# Patient Record
Sex: Male | Born: 1975 | Race: Black or African American | Hispanic: No | Marital: Single | Smoking: Never smoker
Health system: Southern US, Community
[De-identification: ages and names within clinical notes are randomized; demographics above are authoritative.]

## PROBLEM LIST (undated history)

## (undated) DIAGNOSIS — I10 Essential (primary) hypertension: Secondary | ICD-10-CM

## (undated) DIAGNOSIS — IMO0001 Reserved for inherently not codable concepts without codable children: Secondary | ICD-10-CM

## (undated) DIAGNOSIS — N189 Chronic kidney disease, unspecified: Secondary | ICD-10-CM

## (undated) DIAGNOSIS — H543 Unqualified visual loss, both eyes: Secondary | ICD-10-CM

## (undated) DIAGNOSIS — D509 Iron deficiency anemia, unspecified: Secondary | ICD-10-CM

## (undated) DIAGNOSIS — H409 Unspecified glaucoma: Secondary | ICD-10-CM

## (undated) DIAGNOSIS — Z992 Dependence on renal dialysis: Secondary | ICD-10-CM

## (undated) DIAGNOSIS — D890 Polyclonal hypergammaglobulinemia: Principal | ICD-10-CM

## (undated) HISTORY — PX: REFRACTIVE SURGERY: SHX103

## (undated) HISTORY — PX: OTHER SURGICAL HISTORY: SHX169

## (undated) HISTORY — DX: Polyclonal hypergammaglobulinemia: D89.0

## (undated) HISTORY — DX: Iron deficiency anemia, unspecified: D50.9

## (undated) HISTORY — PX: EYE SURGERY: SHX253

---

## 2003-09-25 ENCOUNTER — Emergency Department (HOSPITAL_COMMUNITY): Admission: EM | Admit: 2003-09-25 | Discharge: 2003-09-26 | Payer: Self-pay | Admitting: *Deleted

## 2003-09-26 ENCOUNTER — Emergency Department (HOSPITAL_COMMUNITY): Admission: EM | Admit: 2003-09-26 | Discharge: 2003-09-26 | Payer: Self-pay | Admitting: Emergency Medicine

## 2009-10-27 DIAGNOSIS — H409 Unspecified glaucoma: Secondary | ICD-10-CM

## 2009-10-27 HISTORY — DX: Unspecified glaucoma: H40.9

## 2012-01-12 ENCOUNTER — Encounter (HOSPITAL_COMMUNITY): Payer: Self-pay | Admitting: *Deleted

## 2012-01-12 ENCOUNTER — Emergency Department (HOSPITAL_COMMUNITY)
Admission: EM | Admit: 2012-01-12 | Discharge: 2012-01-12 | Disposition: A | Discharge status: Home / Self Care | Payer: Self-pay | Attending: Emergency Medicine | Admitting: Emergency Medicine

## 2012-01-12 DIAGNOSIS — Z794 Long term (current) use of insulin: Secondary | ICD-10-CM | POA: Insufficient documentation

## 2012-01-12 DIAGNOSIS — R609 Edema, unspecified: Secondary | ICD-10-CM | POA: Insufficient documentation

## 2012-01-12 DIAGNOSIS — Z9889 Other specified postprocedural states: Secondary | ICD-10-CM | POA: Insufficient documentation

## 2012-01-12 DIAGNOSIS — H4089 Other specified glaucoma: Secondary | ICD-10-CM | POA: Insufficient documentation

## 2012-01-12 DIAGNOSIS — R51 Headache: Secondary | ICD-10-CM | POA: Insufficient documentation

## 2012-01-12 DIAGNOSIS — E119 Type 2 diabetes mellitus without complications: Secondary | ICD-10-CM | POA: Insufficient documentation

## 2012-01-12 DIAGNOSIS — H409 Unspecified glaucoma: Secondary | ICD-10-CM | POA: Insufficient documentation

## 2012-01-12 DIAGNOSIS — R1110 Vomiting, unspecified: Secondary | ICD-10-CM | POA: Insufficient documentation

## 2012-01-12 DIAGNOSIS — H4050X0 Glaucoma secondary to other eye disorders, unspecified eye, stage unspecified: Secondary | ICD-10-CM

## 2012-01-12 MED ORDER — OXYCODONE-ACETAMINOPHEN 5-325 MG PO TABS
1.0000 | ORAL_TABLET | Freq: Once | ORAL | Status: AC
  Administered 2012-01-12: 1 via ORAL
  Filled 2012-01-12: qty 1

## 2012-01-12 MED ORDER — TETRACAINE HCL 0.5 % OP SOLN
OPHTHALMIC | Status: AC
  Administered 2012-01-12: 08:00:00
  Filled 2012-01-12: qty 2

## 2012-01-12 NOTE — ED Notes (Signed)
States he awoke with headache this am.  Hx of Laser eye surgery both eyes approx 2 weeks ago, then this past Wednesday 3/13 saw his eye doctor and was told he has Glaucoma - the Dr put needles in his left eye.    With HA has experienced frequent bouts of vomiting, small amounts of clear liquid, "dry heaves".  Thought his BP was up, checked it at home and was 230/? Does not have history of high blood pressure.

## 2012-01-12 NOTE — ED Notes (Signed)
Called Dr. Resa Miner again and Sect. Will have  Him return call.

## 2012-01-12 NOTE — Discharge Instructions (Signed)
You have a form of glaucoma which can rapidly lead to permanent blindness. This is also likely to start in your other eye. Your left eye may be permanently blind already due to delay in treatment. I have made an appointment for you with [facility] eye clinic at 2 PM. The clinic is located at [address] in [place]. Make sure you you go to this appointment. Failure to treat this appropriately can leave you permanently blind in both eyes. The phone number for the clinic is [phone].

## 2012-01-12 NOTE — ED Provider Notes (Addendum)
History   This chart was scribed for Dione Booze, MD by Clarita Crane. The patient was seen in room APA11/APA11. Patient's care was started at 0607.    CSN: [id number]  Arrival date & time 01/12/12  0607   First MD Initiated Contact with Patient 01/12/12 [phone]      Chief Complaint  Patient presents with  . Headache  . Nausea  . Hypertension    (Consider location/radiation/quality/duration/timing/severity/associated sxs/prior treatment) HPI Donald Sampson is a 36 y.o. male who presents to the Emergency Department complaining of constant moderate throbbing pain behind left eye radiating to posterior aspect of head and neck onset 5 days ago after having refractive surgery performed on left eye by Dr. Lelon Perla and worsening since with 1 episode of vomiting just prior to examination in ED. Rates pain an 8 out of 10 currently and an 8 out of 10 at its worst. Patient states left eye pain is aggravated by light exposure and relieved with use of Percocet. States he recently ran out of Percocet medication provided after surgery by Dr. Lelon Perla. Denies associated nausea. Patient with h/o diabetes. Patient is a non smoker and non-drinker.  PCP- Felecia Shelling  Past Medical History  Diagnosis Date  . Diabetes mellitus     Past Surgical History  Procedure Date  . Refractive surgery     No family history on file.  History  Substance Use Topics  . Smoking status: Not on file  . Smokeless tobacco: Not on file  . Alcohol Use:       Review of Systems  Eyes: Positive for photophobia and pain (left). Negative for discharge.  Gastrointestinal: Positive for vomiting. Negative for nausea.  All other systems reviewed and are negative.    Allergies  Review of patient's allergies indicates no known allergies.  Home Medications   Current Outpatient Rx  Name Route Sig Dispense Refill  . GLYBURIDE 5 MG PO TABS Oral Take 5 mg by mouth daily with breakfast.    . INSULIN GLARGINE 100 UNIT/ML [place]  SOLN Subcutaneous Inject 15 Units into the skin at bedtime.    Marland Kitchen METFORMIN HCL 500 MG PO TABS Oral Take 500 mg by mouth 2 (two) times daily with a meal.      BP 160/98  Pulse 103  Temp 98.7 F (37.1 C)  Resp 20  Ht 6' (1.829 m)  Wt 270 lb (122.471 kg)  BMI 36.62 kg/m2  SpO2 100%  Physical Exam  Nursing note and vitals reviewed. Constitutional: He is oriented to person, place, and time. He appears well-developed and well-nourished. No distress.  HENT:  Head: Normocephalic and atraumatic.  Eyes:       Right pupil 3mm and reactive. Left pupil 6mm, eccentric and non-reactive. Unable to visualize fundus of left eye. Moderate conjunctival injection of left eye. Vision in the left eye: he can barely detect hand motion.  Neck: Normal range of motion. Neck supple. No tracheal deviation present.  Cardiovascular: Normal rate and regular rhythm.  Exam reveals no gallop and no friction rub.   No murmur heard. Pulmonary/Chest: Effort normal. No respiratory distress. He has no wheezes. He has no rales.  Abdominal: Soft. Bowel sounds are normal. He exhibits no distension.  Musculoskeletal: Normal range of motion. He exhibits edema (1+ and pitting).  Neurological: He is alert and oriented to person, place, and time. No sensory deficit.  Skin: Skin is warm and dry.  Psychiatric: He has a normal mood and affect. His behavior is normal.  ED Course  Procedures (including critical care time)  DIAGNOSTIC STUDIES: Oxygen Saturation is 100% on room air, normal by my interpretation.    COORDINATION OF CARE: 7:17AM- Patient informed of current plan for treatment and evaluation and agrees with plan at this time.  7:36AM- Dione Booze, MD to bedside to measure intraocular pressure. Tetracaine administered prior to measurement. Intraocular pressure measured at within left eye. 8:50AM- Consult complete with Dr. Lelon Perla. Patient case explained and discussed. Dr. Allyne Gee recommends patient be  evaluated by specialist at [facility]. He was diagnosed with neovascular glaucoma and this is likely to start in his other eye. Dr. Allyne Gee is concerned that he may be permanently blind in his affected eye and he needs emergent evaluation to prevent problems in his other eye. 8:55AM- Patient informed of consult with Dr. Lelon Perla and urgent need for evaluation by specialist at [facility].  8:58AM- Consult with [facility] in [place] performed. Explained patient case and consult with Dr. Allyne Gee. Appointment made for 2PM today   1. Neovascular glaucoma    CRITICAL CARE Performed by: ZOXWR,UEAVW   Total critical care time: 35 minutes  Critical care time was exclusive of separately billable procedures and treating other patients.  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  Critical care was time spent personally by me on the following activities: development of treatment plan with patient and/or surrogate as well as nursing, discussions with consultants, evaluation of patient's response to treatment, examination of patient, obtaining history from patient or surrogate, ordering and performing treatments and interventions, ordering and review of laboratory studies, ordering and review of radiographic studies, pulse oximetry and re-evaluation of patient's condition.    MDM  Left eye pain after ophthalmology procedure. He clearly has glaucoma and vomiting that I. I will need to discuss his management with his ophthalmologist. Call has been placed to the physician covering for Dr. Allyne Gee.      I personally performed the services described in this documentation, which was scribed in my presence. The recorded information has been reviewed and considered.     Dione Booze, MD 01/12/12 0981  Dione Booze, MD 01/12/12 [phone]
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
## 2012-10-16 ENCOUNTER — Emergency Department (HOSPITAL_COMMUNITY): Payer: Medicaid Other

## 2012-10-16 ENCOUNTER — Emergency Department (HOSPITAL_COMMUNITY)
Admission: EM | Admit: 2012-10-16 | Discharge: 2012-10-16 | Disposition: A | Discharge status: Home / Self Care | Payer: Medicaid Other | Attending: Emergency Medicine | Admitting: Emergency Medicine

## 2012-10-16 ENCOUNTER — Other Ambulatory Visit: Payer: Self-pay

## 2012-10-16 ENCOUNTER — Encounter (HOSPITAL_COMMUNITY): Payer: Self-pay | Admitting: *Deleted

## 2012-10-16 DIAGNOSIS — E119 Type 2 diabetes mellitus without complications: Secondary | ICD-10-CM | POA: Insufficient documentation

## 2012-10-16 DIAGNOSIS — Z794 Long term (current) use of insulin: Secondary | ICD-10-CM | POA: Insufficient documentation

## 2012-10-16 DIAGNOSIS — I10 Essential (primary) hypertension: Secondary | ICD-10-CM | POA: Insufficient documentation

## 2012-10-16 DIAGNOSIS — R42 Dizziness and giddiness: Secondary | ICD-10-CM | POA: Insufficient documentation

## 2012-10-16 DIAGNOSIS — Z79899 Other long term (current) drug therapy: Secondary | ICD-10-CM | POA: Insufficient documentation

## 2012-10-16 HISTORY — DX: Essential (primary) hypertension: I10

## 2012-10-16 LAB — CBC WITH DIFFERENTIAL/PLATELET
Basophils Absolute: 0.1 10*3/uL (ref 0.0–0.1)
Basophils Relative: 1 % (ref 0–1)
Eosinophils Absolute: 0.2 10*3/uL (ref 0.0–0.7)
Eosinophils Relative: 2 % (ref 0–5)
HCT: 37.3 % — ABNORMAL LOW (ref 39.0–52.0)
Hemoglobin: 12.4 g/dL — ABNORMAL LOW (ref 13.0–17.0)
Lymphocytes Relative: 22 % (ref 12–46)
Lymphs Abs: 2.4 10*3/uL (ref 0.7–4.0)
MCH: 24.7 pg — ABNORMAL LOW (ref 26.0–34.0)
MCHC: 33.2 g/dL (ref 30.0–36.0)
MCV: 74.2 fL — ABNORMAL LOW (ref 78.0–100.0)
Monocytes Absolute: 1 10*3/uL (ref 0.1–1.0)
Monocytes Relative: 9 % (ref 3–12)
Neutro Abs: 7.1 10*3/uL (ref 1.7–7.7)
Neutrophils Relative %: 66 % (ref 43–77)
Platelets: 349 10*3/uL (ref 150–400)
RBC: 5.03 MIL/uL (ref 4.22–5.81)
RDW: 13.8 % (ref 11.5–15.5)
WBC: 10.8 10*3/uL — ABNORMAL HIGH (ref 4.0–10.5)

## 2012-10-16 LAB — BASIC METABOLIC PANEL
BUN: 35 mg/dL — ABNORMAL HIGH (ref 6–23)
CO2: 26 mEq/L (ref 19–32)
Calcium: 9 mg/dL (ref 8.4–10.5)
Chloride: 100 mEq/L (ref 96–112)
Creatinine, Ser: 3.28 mg/dL — ABNORMAL HIGH (ref 0.50–1.35)
GFR calc Af Amer: 26 mL/min — ABNORMAL LOW (ref >90)
GFR calc non Af Amer: 23 mL/min — ABNORMAL LOW (ref >90)
Glucose, Bld: 208 mg/dL — ABNORMAL HIGH (ref 70–99)
Potassium: 4.1 mEq/L (ref 3.5–5.1)
Sodium: 135 mEq/L (ref 135–145)

## 2012-10-16 LAB — TROPONIN I: Troponin I: <0.3 ng/mL (ref <0.30)

## 2012-10-16 MED ORDER — SODIUM CHLORIDE 0.9 % IV SOLN
INTRAVENOUS | Status: DC
  Administered 2012-10-16: 18:00:00 via INTRAVENOUS

## 2012-10-16 MED ORDER — SODIUM CHLORIDE 0.9 % IV BOLUS (SEPSIS)
500.0000 mL | Freq: Once | INTRAVENOUS | Status: AC
  Administered 2012-10-16: 500 mL via INTRAVENOUS

## 2012-10-16 NOTE — ED Notes (Addendum)
Reports has been out of bp meds for a while.  C/o dizziness/lightheadedness and headache.
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
## 2012-10-23 ENCOUNTER — Emergency Department (HOSPITAL_COMMUNITY)
Admission: EM | Admit: 2012-10-23 | Discharge: 2012-10-23 | Disposition: A | Discharge status: Home / Self Care | Payer: Medicaid Other | Attending: Emergency Medicine | Admitting: Emergency Medicine

## 2012-10-23 ENCOUNTER — Encounter (HOSPITAL_COMMUNITY): Payer: Self-pay | Admitting: *Deleted

## 2012-10-23 DIAGNOSIS — Z91199 Patient's noncompliance with other medical treatment and regimen due to unspecified reason: Secondary | ICD-10-CM | POA: Insufficient documentation

## 2012-10-23 DIAGNOSIS — Z9889 Other specified postprocedural states: Secondary | ICD-10-CM | POA: Insufficient documentation

## 2012-10-23 DIAGNOSIS — H5710 Ocular pain, unspecified eye: Secondary | ICD-10-CM | POA: Insufficient documentation

## 2012-10-23 DIAGNOSIS — Z9114 Patient's other noncompliance with medication regimen: Secondary | ICD-10-CM

## 2012-10-23 DIAGNOSIS — Z79899 Other long term (current) drug therapy: Secondary | ICD-10-CM | POA: Insufficient documentation

## 2012-10-23 DIAGNOSIS — R51 Headache: Secondary | ICD-10-CM

## 2012-10-23 DIAGNOSIS — Z8669 Personal history of other diseases of the nervous system and sense organs: Secondary | ICD-10-CM

## 2012-10-23 DIAGNOSIS — E119 Type 2 diabetes mellitus without complications: Secondary | ICD-10-CM | POA: Insufficient documentation

## 2012-10-23 DIAGNOSIS — H409 Unspecified glaucoma: Secondary | ICD-10-CM | POA: Insufficient documentation

## 2012-10-23 DIAGNOSIS — I10 Essential (primary) hypertension: Secondary | ICD-10-CM

## 2012-10-23 DIAGNOSIS — Z9119 Patient's noncompliance with other medical treatment and regimen: Secondary | ICD-10-CM | POA: Insufficient documentation

## 2012-10-23 MED ORDER — TETRACAINE HCL 0.5 % OP SOLN
2.0000 [drp] | Freq: Once | OPHTHALMIC | Status: AC
  Administered 2012-10-23: 2 [drp] via OPHTHALMIC
  Filled 2012-10-23: qty 2

## 2012-10-23 MED ORDER — CLONIDINE HCL 0.2 MG PO TABS
0.2000 mg | ORAL_TABLET | Freq: Once | ORAL | Status: AC
  Administered 2012-10-23: 0.2 mg via ORAL
  Filled 2012-10-23: qty 1

## 2012-10-23 MED ORDER — OXYCODONE-ACETAMINOPHEN 5-325 MG PO TABS
1.0000 | ORAL_TABLET | Freq: Once | ORAL | Status: AC
  Administered 2012-10-23: 1 via ORAL
  Filled 2012-10-23: qty 1

## 2012-10-23 MED ORDER — TIMOLOL MALEATE 0.5 % OP SOLN
1.0000 [drp] | OPHTHALMIC | Status: AC
  Administered 2012-10-23: 1 [drp] via OPHTHALMIC
  Filled 2012-10-23: qty 5

## 2012-10-23 MED ORDER — AMLODIPINE BESYLATE 5 MG PO TABS: 10.0000 mg | ORAL_TABLET | Freq: Once | ORAL | Status: DC

## 2012-10-23 MED ORDER — OXYCODONE-ACETAMINOPHEN 5-325 MG PO TABS: 1.0000 | ORAL_TABLET | ORAL | Status: DC | PRN

## 2012-10-23 MED ORDER — AMLODIPINE BESYLATE 5 MG PO TABS
5.0000 mg | ORAL_TABLET | Freq: Once | ORAL | Status: AC
  Administered 2012-10-23: 5 mg via ORAL
  Filled 2012-10-23: qty 1

## 2012-10-23 NOTE — ED Notes (Addendum)
Pt sitting up on side of bed eating'  

## 2012-10-23 NOTE — ED Provider Notes (Signed)
History     CSN: [id number]  Arrival date & time 10/23/12  0305   First MD Initiated Contact with Patient 10/23/12 [phone]      Chief Complaint  Patient presents with  . Hypertension    (Consider location/radiation/quality/duration/timing/severity/associated sxs/prior treatment) HPIFranklin M Sampson is a 36 y.o. male with history of diabetes, hypertension and glaucoma. Patient is had multiple surgeries in the left eye including laser treatment by Dr. Lelon Perla at [facility], he was seen in the emergency department in March for his glaucoma as well. Recently, the patient says he has been off his blood pressure medication because he had no refills and had no time to get it refilled. He got it refilled here in the ER on 12/21 and says he's been taking his Norvasc and lisinopril as directed since then.  He is complaining about a headache that is 4/10, is throbbing about his left thigh, it is similar to prior headaches from his eye pain and glaucoma.  Patient says he feels like his blood pressure is up tonight.  Patient has no vision in the left eye and poor vision in the right eye, it is blurry, this is chronic.  Patient's primary care physician is Dr. Felecia Shelling     Past Medical History  Diagnosis Date  . Diabetes mellitus   . Hypertension     Past Surgical History  Procedure Date  . Refractive surgery     History reviewed. No pertinent family history.  History  Substance Use Topics  . Smoking status: Never Smoker   . Smokeless tobacco: Not on file  . Alcohol Use: No      Review of Systems At least 10pt or greater review of systems completed and are negative except where specified in the HPI.  Allergies  Review of patient's allergies indicates no known allergies.  Home Medications   Current Outpatient Rx  Name  Route  Sig  Dispense  Refill  . AMLODIPINE BESYLATE 5 MG PO TABS   Oral   Take 5 mg by mouth daily.         Marland Kitchen LISINOPRIL PO   Oral   Take by mouth.         .  GLYBURIDE 5 MG PO TABS   Oral   Take 5 mg by mouth daily with breakfast.         . INSULIN GLARGINE 100 UNIT/ML [place] SOLN   Subcutaneous   Inject 15 Units into the skin at bedtime.           BP 193/110  Pulse 96  Temp 98.1 F (36.7 C) (Oral)  Resp 20  Ht 6\' 2"  (1.88 m)  Wt 265 lb (120.203 kg)  BMI 34.02 kg/m2  SpO2 100%  Physical Exam  Nursing notes reviewed.  Electronic medical record reviewed. VITAL SIGNS:   Filed Vitals:   10/23/12 0321 10/23/12 0517 10/23/12 0644 10/23/12 0733  BP: 193/110 175/104 183/103 160/100  Pulse: 96 91 86 92  Temp: 98.1 F (36.7 C) 98.3 F (36.8 C)    TempSrc: Oral     Resp: 20 20 20    Height: 6\' 2"  (1.88 m)     Weight: 265 lb (120.203 kg)     SpO2: 100% 100% 100%    CONSTITUTIONAL: Awake, oriented, appears non-toxic HENT: Atraumatic, normocephalic, oral mucosa pink and moist, airway patent. Nares patent without drainage. External ears normal. EYES: Left eye conjunctiva injected, extraocular movements are intact. Right pupil 3mm and reactive - can count fingers  at about 14 inches from his face, but can't participate using an eye chart. He says this is his baseline vision in his right eye. Left pupil 6mm, eccentric and non-reactive - no vision in the left eye. Pressure in the right eye is 14. Pressure in the left eye is 81 NECK: Trachea midline, non-tender, supple CARDIOVASCULAR: Normal heart rate, Normal rhythm, No murmurs, rubs, gallops PULMONARY/CHEST: Clear to auscultation, no rhonchi, wheezes, or rales. Symmetrical breath sounds. Non-tender. ABDOMINAL: Non-distended, soft, non-tender - no rebound or guarding.  BS normal. NEUROLOGIC: Non-focal, moving all four extremities, no gross sensory or motor deficits. EXTREMITIES: No clubbing, cyanosis, or edema SKIN: Warm, Dry, No erythema, No rash  ED Course  Procedures (including critical care time)  Date: 10/23/2012  Rate: 93  Rhythm: normal sinus rhythm  QRS Axis: normal  Intervals:  normal  ST/T Wave abnormalities: normal  Conduction Disutrbances: none  Narrative Interpretation: unremarkable -  No changes since prior EKG dated 10/16/2012   Labs Reviewed - No data to display No results found.   1. Hypertension   2. Non compliance w medication regimen   3. H/O chronic angle-closure glaucoma   4. Eye pain   5. Headache       MDM  Donald Sampson is a 36 y.o. male presents because he thinks his blood pressure is up, he's got a mild headache behind his left eye-he says this happens when his pressure in his eyes up. Patient has been noncompliant with his medications including his eyedrops - we'll start him with some eyedrops timolol 0.5%, we'll also give another Norvasc 5 mg. Patient says he was also taking Percocet for his headache and high pain but has run out-will give him one Percocet as requested.  Patient's pain has gotten better, his blood pressure has come down. Patient is still asymptomatic with respect to his pressure, no chest pain, no encephalopathy.  Visual pathology is chronic. At this point, I do not see the need to consult the patient's ophthalmologist, patient knows he needs to see his physician at [facility]. Also spoken with him, he will followup with Dr. Felecia Shelling to discuss further blood pressure management. The last blood pressure I saw, was 143/80 -  I think some of his blood pressure issues were secondary to his headache pain. I think the headache pain likely resolved with timolol eyedrops.  No sign or suggestion of stroke, ACS.  I do not think any further laboratory testing or imaging is needed at this time  I explained the diagnosis and have given explicit precautions to return to the ER including any other new or worsening symptoms. The patient understands and accepts the medical plan as it's been dictated and I have answered their questions. Discharge instructions concerning home care and prescriptions have been given.  The patient is STABLE and is  discharged to home in good condition.          Jones Skene, MD 10/23/12 [phone]

## 2012-10-23 NOTE — ED Notes (Signed)
Pt given ginger ale.

## 2012-10-23 NOTE — ED Notes (Addendum)
Pt feels like his BP is up. Feels like he has pressure in his eyes.

## 2012-10-23 NOTE — ED Notes (Addendum)
Called [facility] about needing medication, EDP made aware

## 2012-11-06 ENCOUNTER — Encounter (HOSPITAL_COMMUNITY): Payer: Self-pay | Admitting: *Deleted

## 2012-11-06 ENCOUNTER — Inpatient Hospital Stay (HOSPITAL_COMMUNITY)
Admission: EM | Admit: 2012-11-06 | Discharge: 2012-11-10 | DRG: 872 | Disposition: A | Discharge status: Home / Self Care | Payer: Medicaid Other | Attending: Emergency Medicine | Admitting: Emergency Medicine

## 2012-11-06 DIAGNOSIS — L0291 Cutaneous abscess, unspecified: Secondary | ICD-10-CM

## 2012-11-06 DIAGNOSIS — Z79899 Other long term (current) drug therapy: Secondary | ICD-10-CM

## 2012-11-06 DIAGNOSIS — N184 Chronic kidney disease, stage 4 (severe): Secondary | ICD-10-CM | POA: Diagnosis present

## 2012-11-06 DIAGNOSIS — E871 Hypo-osmolality and hyponatremia: Secondary | ICD-10-CM | POA: Diagnosis present

## 2012-11-06 DIAGNOSIS — I129 Hypertensive chronic kidney disease with stage 1 through stage 4 chronic kidney disease, or unspecified chronic kidney disease: Secondary | ICD-10-CM | POA: Diagnosis present

## 2012-11-06 DIAGNOSIS — H5440 Blindness, one eye, unspecified eye: Secondary | ICD-10-CM | POA: Diagnosis present

## 2012-11-06 DIAGNOSIS — A419 Sepsis, unspecified organism: Principal | ICD-10-CM | POA: Diagnosis present

## 2012-11-06 DIAGNOSIS — H409 Unspecified glaucoma: Secondary | ICD-10-CM | POA: Diagnosis present

## 2012-11-06 DIAGNOSIS — E119 Type 2 diabetes mellitus without complications: Secondary | ICD-10-CM

## 2012-11-06 DIAGNOSIS — D509 Iron deficiency anemia, unspecified: Secondary | ICD-10-CM | POA: Diagnosis present

## 2012-11-06 DIAGNOSIS — M949 Disorder of cartilage, unspecified: Secondary | ICD-10-CM | POA: Diagnosis present

## 2012-11-06 DIAGNOSIS — Z794 Long term (current) use of insulin: Secondary | ICD-10-CM

## 2012-11-06 DIAGNOSIS — N189 Chronic kidney disease, unspecified: Secondary | ICD-10-CM | POA: Diagnosis present

## 2012-11-06 DIAGNOSIS — E1139 Type 2 diabetes mellitus with other diabetic ophthalmic complication: Secondary | ICD-10-CM | POA: Diagnosis present

## 2012-11-06 DIAGNOSIS — E11319 Type 2 diabetes mellitus with unspecified diabetic retinopathy without macular edema: Secondary | ICD-10-CM | POA: Diagnosis present

## 2012-11-06 DIAGNOSIS — L0211 Cutaneous abscess of neck: Secondary | ICD-10-CM | POA: Diagnosis present

## 2012-11-06 DIAGNOSIS — M899 Disorder of bone, unspecified: Secondary | ICD-10-CM | POA: Diagnosis present

## 2012-11-06 DIAGNOSIS — E1129 Type 2 diabetes mellitus with other diabetic kidney complication: Secondary | ICD-10-CM | POA: Diagnosis present

## 2012-11-06 DIAGNOSIS — E559 Vitamin D deficiency, unspecified: Secondary | ICD-10-CM | POA: Diagnosis present

## 2012-11-06 DIAGNOSIS — L03221 Cellulitis of neck: Secondary | ICD-10-CM | POA: Diagnosis present

## 2012-11-06 HISTORY — DX: Unspecified glaucoma: H40.9

## 2012-11-06 LAB — COMPREHENSIVE METABOLIC PANEL
ALT: 22 U/L (ref 0–53)
Albumin: 2.7 g/dL — ABNORMAL LOW (ref 3.5–5.2)
Alkaline Phosphatase: 68 U/L (ref 39–117)
Glucose, Bld: 299 mg/dL — ABNORMAL HIGH (ref 70–99)
Potassium: 4.4 mEq/L (ref 3.5–5.1)
Sodium: 130 mEq/L — ABNORMAL LOW (ref 135–145)
Total Protein: 7.6 g/dL (ref 6.0–8.3)

## 2012-11-06 LAB — CBC WITH DIFFERENTIAL/PLATELET
Basophils Absolute: 0 10*3/uL (ref 0.0–0.1)
HCT: 32.3 % — ABNORMAL LOW (ref 39.0–52.0)
Lymphocytes Relative: 11 % — ABNORMAL LOW (ref 12–46)
Monocytes Relative: 7 % (ref 3–12)
Neutro Abs: 12.3 10*3/uL — ABNORMAL HIGH (ref 1.7–7.7)
RBC: 4.37 MIL/uL (ref 4.22–5.81)
RDW: 13.7 % (ref 11.5–15.5)
WBC: 15.3 10*3/uL — ABNORMAL HIGH (ref 4.0–10.5)

## 2012-11-06 LAB — GLUCOSE, CAPILLARY: Glucose-Capillary: 207 mg/dL — ABNORMAL HIGH (ref 70–99)

## 2012-11-06 MED ORDER — INSULIN GLARGINE 100 UNIT/ML ~~LOC~~ SOLN: 15.0000 [IU] | Freq: Every day | SUBCUTANEOUS | Status: DC

## 2012-11-06 MED ORDER — HYDROMORPHONE HCL PF 1 MG/ML IJ SOLN
1.0000 mg | INTRAMUSCULAR | Status: AC | PRN
  Administered 2012-11-06: 1 mg via INTRAVENOUS
  Filled 2012-11-06: qty 1

## 2012-11-06 MED ORDER — LISINOPRIL 10 MG PO TABS: 20.0000 mg | ORAL_TABLET | Freq: Every day | ORAL | Status: DC

## 2012-11-06 MED ORDER — VANCOMYCIN HCL 10 G IV SOLR
1750.0000 mg | INTRAVENOUS | Status: DC
  Administered 2012-11-07 – 2012-11-09 (×3): 1750 mg via INTRAVENOUS
  Filled 2012-11-06 (×3): qty 1750

## 2012-11-06 MED ORDER — AMLODIPINE BESYLATE 5 MG PO TABS: 5.0000 mg | ORAL_TABLET | Freq: Every day | ORAL | Status: DC

## 2012-11-06 MED ORDER — ONDANSETRON HCL 4 MG/2ML IJ SOLN
4.0000 mg | Freq: Three times a day (TID) | INTRAMUSCULAR | Status: AC | PRN
  Administered 2012-11-06: 4 mg via INTRAVENOUS
  Filled 2012-11-06: qty 2

## 2012-11-06 MED ORDER — SODIUM CHLORIDE 0.9 % IV SOLN
INTRAVENOUS | Status: DC
  Administered 2012-11-06 – 2012-11-10 (×7): via INTRAVENOUS

## 2012-11-06 MED ORDER — INSULIN ASPART 100 UNIT/ML ~~LOC~~ SOLN
4.0000 [IU] | Freq: Three times a day (TID) | SUBCUTANEOUS | Status: DC
  Administered 2012-11-07 – 2012-11-10 (×10): 4 [IU] via SUBCUTANEOUS

## 2012-11-06 MED ORDER — LISINOPRIL 10 MG PO TABS
20.0000 mg | ORAL_TABLET | Freq: Every day | ORAL | Status: DC
  Administered 2012-11-07: 20 mg via ORAL
  Filled 2012-11-06: qty 2

## 2012-11-06 MED ORDER — VANCOMYCIN HCL IN DEXTROSE 1-5 GM/200ML-% IV SOLN
INTRAVENOUS | Status: AC
  Filled 2012-11-06: qty 200

## 2012-11-06 MED ORDER — GLYBURIDE 5 MG PO TABS
5.0000 mg | ORAL_TABLET | Freq: Every day | ORAL | Status: DC
  Administered 2012-11-07 – 2012-11-10 (×4): 5 mg via ORAL
  Filled 2012-11-06 (×6): qty 1

## 2012-11-06 MED ORDER — LIDOCAINE HCL (PF) 1 % IJ SOLN
INTRAMUSCULAR | Status: AC
  Administered 2012-11-06: 18:00:00
  Filled 2012-11-06: qty 5

## 2012-11-06 MED ORDER — DORZOLAMIDE HCL 2 % OP SOLN
1.0000 [drp] | Freq: Two times a day (BID) | OPHTHALMIC | Status: DC
  Administered 2012-11-07 – 2012-11-09 (×6): 1 [drp] via OPHTHALMIC
  Filled 2012-11-06: qty 10

## 2012-11-06 MED ORDER — VANCOMYCIN HCL IN DEXTROSE 1-5 GM/200ML-% IV SOLN
1000.0000 mg | Freq: Once | INTRAVENOUS | Status: AC
  Administered 2012-11-06: 1000 mg via INTRAVENOUS
  Filled 2012-11-06: qty 200

## 2012-11-06 MED ORDER — AMLODIPINE BESYLATE 5 MG PO TABS
5.0000 mg | ORAL_TABLET | Freq: Every day | ORAL | Status: DC
  Administered 2012-11-07: 5 mg via ORAL
  Filled 2012-11-06: qty 1

## 2012-11-06 MED ORDER — DORZOLAMIDE HCL-TIMOLOL MAL 2-0.5 % OP SOLN: 1.0000 [drp] | Freq: Two times a day (BID) | OPHTHALMIC | Status: DC

## 2012-11-06 MED ORDER — CLONIDINE HCL 0.1 MG PO TABS
0.1000 mg | ORAL_TABLET | Freq: Four times a day (QID) | ORAL | Status: DC | PRN
  Administered 2012-11-06 – 2012-11-09 (×4): 0.1 mg via ORAL
  Filled 2012-11-06 (×4): qty 1

## 2012-11-06 MED ORDER — INSULIN GLARGINE 100 UNIT/ML ~~LOC~~ SOLN
15.0000 [IU] | Freq: Every day | SUBCUTANEOUS | Status: DC
  Administered 2012-11-06: 15 [IU] via SUBCUTANEOUS

## 2012-11-06 MED ORDER — TIMOLOL MALEATE 0.5 % OP SOLN
1.0000 [drp] | Freq: Two times a day (BID) | OPHTHALMIC | Status: DC
  Administered 2012-11-07 – 2012-11-09 (×6): 1 [drp] via OPHTHALMIC
  Filled 2012-11-06: qty 5

## 2012-11-06 MED ORDER — INSULIN ASPART 100 UNIT/ML ~~LOC~~ SOLN
0.0000 [IU] | Freq: Three times a day (TID) | SUBCUTANEOUS | Status: DC
  Administered 2012-11-07: 3 [IU] via SUBCUTANEOUS
  Administered 2012-11-07: 7 [IU] via SUBCUTANEOUS
  Administered 2012-11-07: 4 [IU] via SUBCUTANEOUS
  Administered 2012-11-08 (×2): 7 [IU] via SUBCUTANEOUS
  Administered 2012-11-08: 3 [IU] via SUBCUTANEOUS
  Administered 2012-11-09: 4 [IU] via SUBCUTANEOUS
  Administered 2012-11-09 (×2): 3 [IU] via SUBCUTANEOUS
  Administered 2012-11-10: 4 [IU] via SUBCUTANEOUS

## 2012-11-06 MED ORDER — ACETAMINOPHEN 500 MG PO TABS
500.0000 mg | ORAL_TABLET | ORAL | Status: DC | PRN
  Administered 2012-11-06 – 2012-11-09 (×6): 500 mg via ORAL
  Filled 2012-11-06 (×6): qty 1

## 2012-11-06 MED ORDER — INSULIN ASPART 100 UNIT/ML ~~LOC~~ SOLN
0.0000 [IU] | Freq: Every day | SUBCUTANEOUS | Status: DC
  Administered 2012-11-06: 3 [IU] via SUBCUTANEOUS

## 2012-11-06 NOTE — ED Notes (Signed)
Pt co knott on base of neck x 2 days, also wants lt eye pressure checked, unable to see his doctor at [facility]

## 2012-11-06 NOTE — ED Provider Notes (Signed)
History     CSN: [id number]  Arrival date & time 11/06/12  1457   First MD Initiated Contact with Patient 11/06/12 1612      Chief Complaint  Patient presents with  . Eye Problem    pressure check for lt eye  . Abscess    back of neck    (Consider location/radiation/quality/duration/timing/severity/associated sxs/prior treatment) Patient is a 37 y.o. male presenting with abscess. The history is provided by the patient (the pt states that he has swlling and tenderness to back of neck). No language interpreter was used.  Abscess  This is a new problem. The current episode started less than one week ago. The onset was gradual. The problem occurs rarely. The problem has been unchanged. Affected Location: back of neck. The problem is moderate. The abscess is characterized by redness. Associated with: nothing. The abscess first occurred at home. Pertinent negatives include no anorexia, no diarrhea, no congestion and no cough.    Past Medical History  Diagnosis Date  . Diabetes mellitus   . Hypertension   . Glaucoma (increased eye pressure) 2011    Past Surgical History  Procedure Date  . Refractive surgery     History reviewed. No pertinent family history.  History  Substance Use Topics  . Smoking status: Never Smoker   . Smokeless tobacco: Not on file  . Alcohol Use: No      Review of Systems  Constitutional: Negative for fatigue.  HENT: Negative for congestion, sinus pressure and ear discharge.   Eyes: Negative for discharge.  Respiratory: Negative for cough.   Cardiovascular: Negative for chest pain.  Gastrointestinal: Negative for abdominal pain, diarrhea and anorexia.  Genitourinary: Negative for frequency and hematuria.  Musculoskeletal: Negative for back pain.  Skin: Positive for rash.  Neurological: Negative for seizures and headaches.  Hematological: Negative.   Psychiatric/Behavioral: Negative for hallucinations.    Allergies  Review of patient's  allergies indicates no known allergies.  Home Medications   Current Outpatient Rx  Name  Route  Sig  Dispense  Refill  . AMLODIPINE BESYLATE 5 MG PO TABS   Oral   Take 5 mg by mouth daily.         . DORZOLAMIDE HCL-TIMOLOL MAL 22.3-6.8 MG/ML OP SOLN   Left Eye   Place 1 drop into the left eye 2 (two) times daily.         . GLYBURIDE 5 MG PO TABS   Oral   Take 5 mg by mouth daily with breakfast.         . INSULIN GLARGINE 100 UNIT/ML Brandt SOLN   Subcutaneous   Inject 15 Units into the skin at bedtime.         Marland Kitchen LISINOPRIL 20 MG PO TABS   Oral   Take 20 mg by mouth daily.           BP 123/91  Pulse 118  Temp 99 F (37.2 C) (Oral)  Resp 20  Ht 6\' 1"  (1.854 m)  Wt 265 lb (120.203 kg)  BMI 34.96 kg/m2  SpO2 100%  Physical Exam  Constitutional: He is oriented to person, place, and time. He appears well-developed.  HENT:  Head: Normocephalic and atraumatic.  Eyes: Conjunctivae normal and EOM are normal. No scleral icterus.  Neck: Neck supple. No thyromegaly present.       Abscess to back of neck  Cardiovascular: Normal rate and regular rhythm.  Exam reveals no gallop and no friction rub.   No  murmur heard. Pulmonary/Chest: No stridor. He has no wheezes. He has no rales. He exhibits no tenderness.  Abdominal: He exhibits no distension. There is no tenderness. There is no rebound.  Musculoskeletal: Normal range of motion. He exhibits no edema.  Lymphadenopathy:    He has no cervical adenopathy.  Neurological: He is oriented to person, place, and time. Coordination normal.  Skin: Rash noted. There is erythema.       Large abscess to back of neck  Psychiatric: He has a normal mood and affect. His behavior is normal.    ED Course  INCISION AND DRAINAGE Performed by: Augusta Hilbert L Authorized by: Quron Ruddy L Comments: Pt had large abscess to back of neck opened with #11 blade.  Mild pus removed   (including critical care time)  Labs Reviewed  CBC  WITH DIFFERENTIAL - Abnormal; Notable for the following:    WBC 15.3 (*)     Hemoglobin 11.0 (*)     HCT 32.3 (*)     MCV 73.9 (*)     MCH 25.2 (*)     Neutrophils Relative 81 (*)     Lymphocytes Relative 11 (*)     Neutro Abs 12.3 (*)     Monocytes Absolute 1.1 (*)     All other components within normal limits  COMPREHENSIVE METABOLIC PANEL - Abnormal; Notable for the following:    Sodium 130 (*)     Glucose, Bld 299 (*)     BUN 35 (*)     Creatinine, Ser 2.55 (*)     Albumin 2.7 (*)     GFR calc non Af Amer 31 (*)     GFR calc Af Amer 36 (*)     All other components within normal limits  WOUND CULTURE   No results found.   1. Abscess   2. Diabetes       MDM          Benny Lennert, MD 11/06/12 1840

## 2012-11-06 NOTE — ED Notes (Signed)
Pt c/o "left eye pressure" and states he has hx of glaucoma. Pt was to come here by his regular eye doctor. Pt also presents with abscess on back of neck which he first noticed 4 days ago. Pt reports pain in the area.
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
## 2012-11-06 NOTE — Progress Notes (Signed)
ANTIBIOTIC CONSULT NOTE - INITIAL  Pharmacy Consult for Vancomycin Indication: abscess of posterior neck  No Known Allergies  Patient Measurements: Height: 6\' 1"  (185.4 cm) Weight: 268 lb 4.8 oz (121.7 kg) IBW/kg (Calculated) : 79.9   Vital Signs: Temp: 100.8 F (38.2 C) (01/11 2020) Temp src: Oral (01/11 2020) BP: 176/103 mmHg (01/11 2020) Pulse Rate: 120  (01/11 2020) Intake/Output from previous day:   Intake/Output from this shift:    Labs:  Basename 11/06/12 1625  WBC 15.3*  HGB 11.0*  PLT 290  LABCREA --  CREATININE 2.55*   Estimated Creatinine Clearance: 55.2 ml/min (by C-G formula based on Cr of 2.55). No results found for this basename: VANCOTROUGH:2,VANCOPEAK:2,VANCORANDOM:2,GENTTROUGH:2,GENTPEAK:2,GENTRANDOM:2,TOBRATROUGH:2,TOBRAPEAK:2,TOBRARND:2,AMIKACINPEAK:2,AMIKACINTROU:2,AMIKACIN:2, in the last 72 hours   Microbiology: No results found for this or any previous visit (from the past 720 hour(s)).  Medical History: Past Medical History  Diagnosis Date  . Diabetes mellitus   . Hypertension   . Glaucoma (increased eye pressure) 2011    Medications:  Scheduled:    . amLODipine  5 mg Oral Daily  . dorzolamide  1 drop Left Eye BID   And  . timolol  1 drop Left Eye BID  . glyBURIDE  5 mg Oral Q breakfast  . insulin aspart  0-20 Units Subcutaneous TID WC  . insulin aspart  0-5 Units Subcutaneous QHS  . insulin aspart  4 Units Subcutaneous TID WC  . insulin glargine  15 Units Subcutaneous QHS  . [COMPLETED] lidocaine      . lisinopril  20 mg Oral Daily  . [COMPLETED] vancomycin  1,000 mg Intravenous Once  . [DISCONTINUED] amLODipine  5 mg Oral Daily  . [DISCONTINUED] dorzolamide-timolol  1 drop Left Eye BID  . [DISCONTINUED] insulin glargine  15 Units Subcutaneous QHS  . [DISCONTINUED] lisinopril  20 mg Oral Daily   Assessment: 37 yo obese M with hx of DM and CRI presents with abscess on back of neck x 4 days.   Received Vancomycin 1gm in ED at  1644.   Renal function is at patient's baseline.   Goal of Therapy:  Vancomycin trough level 15-20 mcg/ml  Plan:  1) Vancomycin 1gm IV x1 now (for total 2gm loading dose) 2) Vancomycin 1750mg  IV q24h 3) Check Vancomycin trough at steady state 4) Monitor renal function and cx data   Elson Clan 11/06/2012,9:11 PM

## 2012-11-07 LAB — RETICULOCYTES
RBC.: 3.57 MIL/uL — ABNORMAL LOW (ref 4.22–5.81)
Retic Count, Absolute: 32.1 10*3/uL (ref 19.0–186.0)
Retic Ct Pct: 0.9 % (ref 0.4–3.1)

## 2012-11-07 LAB — IRON AND TIBC
Iron: <10 ug/dL — ABNORMAL LOW (ref 42–135)
UIBC: 184 ug/dL (ref 125–400)

## 2012-11-07 LAB — CBC
MCHC: 34 g/dL (ref 30.0–36.0)
Platelets: 263 10*3/uL (ref 150–400)
RDW: 13.6 % (ref 11.5–15.5)
WBC: 13.5 10*3/uL — ABNORMAL HIGH (ref 4.0–10.5)

## 2012-11-07 LAB — GLUCOSE, CAPILLARY
Glucose-Capillary: 310 mg/dL — ABNORMAL HIGH (ref 70–99)
Glucose-Capillary: 126 mg/dL — ABNORMAL HIGH (ref 70–99)

## 2012-11-07 LAB — BASIC METABOLIC PANEL
BUN: 28 mg/dL — ABNORMAL HIGH (ref 6–23)
Chloride: 102 mEq/L (ref 96–112)
GFR calc Af Amer: 37 mL/min — ABNORMAL LOW (ref >90)
GFR calc non Af Amer: 32 mL/min — ABNORMAL LOW (ref >90)
Potassium: 3.9 mEq/L (ref 3.5–5.1)

## 2012-11-07 LAB — FOLATE: Folate: 3.7 ng/mL

## 2012-11-07 LAB — VITAMIN B12: Vitamin B-12: 392 pg/mL (ref 211–911)

## 2012-11-07 LAB — PHOSPHORUS: Phosphorus: 3.3 mg/dL (ref 2.3–4.6)

## 2012-11-07 LAB — FERRITIN: Ferritin: 649 ng/mL — ABNORMAL HIGH (ref 22–322)

## 2012-11-07 MED ORDER — ENOXAPARIN SODIUM 30 MG/0.3ML ~~LOC~~ SOLN
30.0000 mg | SUBCUTANEOUS | Status: DC
  Administered 2012-11-07: 30 mg via SUBCUTANEOUS
  Filled 2012-11-07: qty 0.3

## 2012-11-07 MED ORDER — INSULIN GLARGINE 100 UNIT/ML ~~LOC~~ SOLN
20.0000 [IU] | Freq: Every day | SUBCUTANEOUS | Status: DC
  Administered 2012-11-07 – 2012-11-09 (×3): 20 [IU] via SUBCUTANEOUS

## 2012-11-07 MED ORDER — AMLODIPINE BESYLATE 5 MG PO TABS
10.0000 mg | ORAL_TABLET | Freq: Every day | ORAL | Status: DC
  Administered 2012-11-08 – 2012-11-09 (×2): 10 mg via ORAL
  Filled 2012-11-07 (×2): qty 2

## 2012-11-07 NOTE — Progress Notes (Signed)
Subjective: Patient was admitted yesterday due to abscess and cellulitis of the posterior Neck. He had I&D in ER. He is diabetic and started on IV Vancomycin.  Objective: Vital signs in last 24 hours: Temp:  [99 F (37.2 C)-100.8 F (38.2 C)] 99.7 F (37.6 C) (01/12 0621) Pulse Rate:  [76-122] 110  (01/12 0621) Resp:  [19-20] 19  (01/12 0621) BP: (123-190)/(86-113) 164/92 mmHg (01/12 0621) SpO2:  [100 %] 100 % (01/12 0621) Weight:  [265 lb (120.203 kg)-268 lb 4.8 oz (121.7 kg)] 268 lb 4.8 oz (121.7 kg) (01/11 2020) Weight change:  Last BM Date: 11/06/12  Intake/Output from previous day: 01/11 0701 - 01/12 0700 In: 1793.3 [P.O.:960; I.V.:631.3; IV Piggyback:202] Out: 1450 [Urine:1450]  PHYSICAL EXAM General appearance: alert and no distress Resp: clear to auscultation bilaterally Cardio: S1, S2 normal GI: soft, non-tender; bowel sounds normal; no masses,  no organomegaly Extremities: extremities normal, atraumatic, no cyanosis or edema tender swelling on the posterior neck, I&D done. Dressing in place  Lab Results:    @labtest @ [facility] No results found for this basename: PHART,PCO2,PO2ART,TCO2,HCO3 in the last 72 hours CULTURES No results found for this or any previous visit (from the past 240 hour(s)). Studies/Results: No results found.  Medications: I have reviewed the patient's current medications.  Assesment: 1. Abscess and cellulitis of the posterior Neck 2. DM type poorly controlled 3. Hypertension 4. H/O glaucoma 5. Blind LT eye 5. CRF  Active Problems:  * No active hospital problems. *     Plan: -continue Iv antibiotics -continue wound care Nephrology consult We will monitor cbc/BMP.    LOS: 1 day   Marian Grandt 11/07/2012, 9:18 AM

## 2012-11-07 NOTE — Consult Note (Signed)
ELL TISO MRN: [id number] DOB/AGE: July 29, 1976 36 y.o. Primary Care Physician:FANTA,TESFAYE, MD Admit date: 11/06/2012 Chief Complaint:  Chief Complaint  Patient presents with  . Eye Problem    pressure check for lt eye  . Abscess    back of neck   HPI:  Pt is 37 Year old male with past medical Hx of Uncontrolled DM who presented to ER with c/o swelling .  HPI dates dates back to 1 week ago when pt noticed swelling, it was progressing gradually. Pt came to ER yesterday where I & d was done. Pt was admitted for cellulitis. Pt offers NO other complaints NO C/o Chest pain  NO c/o Dyspnea N c/o Fever. Chills  No c/o hematuria  I was told i have protein in my urine.    Past Medical History  Diagnosis Date  . Diabetes mellitus   . Hypertension   . Glaucoma (increased eye pressure) 2011      Family hx- Mother on  Dialysis   Social History:  reports that he has never smoked. He has never used smokeless tobacco. He reports that he does not drink alcohol or use illicit drugs.   Allergies: No Known Allergies  Medications Prior to Admission  Medication Sig Dispense Refill  . amLODipine (NORVASC) 5 MG tablet Take 5 mg by mouth daily.      . dorzolamide-timolol (COSOPT) 22.3-6.8 MG/ML ophthalmic solution Place 1 drop into the left eye 2 (two) times daily.      Marland Kitchen glyBURIDE (DIABETA) 5 MG tablet Take 5 mg by mouth daily with breakfast.      . insulin glargine (LANTUS) 100 UNIT/ML injection Inject 15 Units into the skin at bedtime.      Marland Kitchen lisinopril (PRINIVIL,ZESTRIL) 20 MG tablet Take 20 mg by mouth daily.           ZDG:LOVFI from the symptoms mentioned above,there are no other symptoms referable to all systems reviewed.     Marland Kitchen amLODipine  5 mg Oral Daily  . dorzolamide  1 drop Left Eye BID   And  . timolol  1 drop Left Eye BID  . glyBURIDE  5 mg Oral Q breakfast  . insulin aspart  0-20 Units Subcutaneous TID WC  . insulin aspart  0-5 Units Subcutaneous QHS  .  insulin aspart  4 Units Subcutaneous TID WC  . insulin glargine  20 Units Subcutaneous QHS  . lisinopril  20 mg Oral Daily  . vancomycin  1,750 mg Intravenous Q24H      Physical Exam: Vital signs in last 24 hours: Temp:  [99 F (37.2 C)-100.8 F (38.2 C)] 99.7 F (37.6 C) (01/12 0621) Pulse Rate:  [76-122] 110  (01/12 0621) Resp:  [19-20] 19  (01/12 0621) BP: (123-190)/(86-113) 164/92 mmHg (01/12 0621) SpO2:  [100 %] 100 % (01/12 0621) Weight:  [265 lb (120.203 kg)-268 lb 4.8 oz (121.7 kg)] 268 lb 4.8 oz (121.7 kg) (01/11 2020) Weight change:  Last BM Date: 11/06/12  Intake/Output from previous day: 01/11 0701 - 01/12 0700 In: 1793.3 [P.O.:960; I.V.:631.3; IV Piggyback:202] Out: 1450 [Urine:1450] Total I/O In: 360 [P.O.:360] Out: -    Physical Exam: General- pt is awake,alert, oriented to time place and person Resp- No acute REsp distress, CTA B/L NO Rhonchi CVS- S1S2 regular in rate and rhythm GIT- BS+, soft, NT, ND EXT- NO LE Edema, Cyanosis CNS- CN 2-12 grossly intact. Moving all 4 extremities Psych- normal mood and affect    Lab Results: CBC  Basename 11/07/12 0608 11/06/12 1625  WBC 13.5* 15.3*  HGB 9.8* 11.0*  HCT 28.8* 32.3*  PLT 263 290    BMET  Basename 11/07/12 0608 11/06/12 1625  NA 131* 130*  K 3.9 4.4  CL 102 99  CO2 22 22  GLUCOSE 244* 299*  BUN 28* 35*  CREATININE 2.49* 2.55*  CALCIUM 8.2* 8.9   Creat trend 2014  2 55==>2.49 2013  3.28 ( December)   MICRO No results found for this or any previous visit (from the past 240 hour(s)).    Lab Results  Component Value Date   CALCIUM 8.2* 11/07/2012      Impression: 1)Renal   AKI secondary to SIRS CKD stage  3b/ 4 . Not sure about CKD duration  as data is less than 3 months old .   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  2)HTN Target Organ damage  CKD  Medication- On RAS  blockers On Calcium Channel Blockers On Central Acting Sympatholytics  3)Anemia HGb at goal (9--11) Etiology ?  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  5)ID admitted with Cellulits on vanco   6)FEN  Normokalemic  Hyponatremic Uncontrolled DM + AKI + Hypovolemia   7)Acid base Co2 at goal     Plan:  Will ask for renal US Will hold ace for now  Will increase amlodipne Will ask for basic CKd work up Will initiate CKD-BMd work up  Will ask for anemia work up Will ask for Proteinuria work up      [employer] 11/07/2012, 11:35 AM

## 2012-11-08 ENCOUNTER — Inpatient Hospital Stay (HOSPITAL_COMMUNITY): Payer: Medicaid Other

## 2012-11-08 LAB — ANTI-DNA ANTIBODY, DOUBLE-STRANDED: ds DNA Ab: 1 IU/mL (ref <30)

## 2012-11-08 LAB — BASIC METABOLIC PANEL
GFR calc Af Amer: 37 mL/min — ABNORMAL LOW (ref >90)
GFR calc non Af Amer: 32 mL/min — ABNORMAL LOW (ref >90)
Potassium: 3.8 mEq/L (ref 3.5–5.1)
Sodium: 137 mEq/L (ref 135–145)

## 2012-11-08 LAB — GLUCOSE, CAPILLARY
Glucose-Capillary: 131 mg/dL — ABNORMAL HIGH (ref 70–99)
Glucose-Capillary: 183 mg/dL — ABNORMAL HIGH (ref 70–99)

## 2012-11-08 LAB — C3 COMPLEMENT: C3 Complement: 150 mg/dL (ref 90–180)

## 2012-11-08 LAB — PTH, INTACT AND CALCIUM
Calcium, Total (PTH): 7.7 mg/dL — ABNORMAL LOW (ref 8.4–10.5)
PTH: 60.7 pg/mL (ref 14.0–72.0)

## 2012-11-08 LAB — CREATININE, URINE, 24 HOUR
Collection Interval-UCRE24: 24 hours
Creatinine, 24H Ur: 2124 mg/d — ABNORMAL HIGH (ref 800–2000)
Creatinine, Urine: 72.01 mg/dL
Urine Total Volume-UCRE24: 2950 mL

## 2012-11-08 LAB — ANCA SCREEN W REFLEX TITER
Atypical p-ANCA Screen: NEGATIVE
c-ANCA Screen: NEGATIVE
p-ANCA Screen: NEGATIVE

## 2012-11-08 LAB — HEPATITIS PANEL, ACUTE
HCV Ab: NEGATIVE
Hep A IgM: NEGATIVE
Hep B C IgM: NEGATIVE
Hepatitis B Surface Ag: NEGATIVE

## 2012-11-08 LAB — MPO/PR-3 (ANCA) ANTIBODIES
Myeloperoxidase Abs: <1 AU/mL (ref <20)
Serine Protease 3: 1 AU/mL (ref <20)

## 2012-11-08 LAB — VITAMIN D 25 HYDROXY (VIT D DEFICIENCY, FRACTURES): Vit D, 25-Hydroxy: <10 ng/mL — ABNORMAL LOW (ref 30–89)

## 2012-11-08 LAB — ANA: Anti Nuclear Antibody(ANA): NEGATIVE

## 2012-11-08 LAB — GLOMERULAR BASEMENT MEMBRANE ANTIBODIES: GBM Ab: <1 AU/mL (ref <20)

## 2012-11-08 LAB — C4 COMPLEMENT: Complement C4, Body Fluid: 31 mg/dL (ref 10–40)

## 2012-11-08 MED ORDER — ENOXAPARIN SODIUM 40 MG/0.4ML ~~LOC~~ SOLN
40.0000 mg | SUBCUTANEOUS | Status: DC
  Administered 2012-11-08 – 2012-11-09 (×2): 40 mg via SUBCUTANEOUS
  Filled 2012-11-08 (×2): qty 0.4

## 2012-11-08 NOTE — Progress Notes (Signed)
Inpatient Diabetes Program Recommendations  AACE/ADA: New Consensus Statement on Inpatient Glycemic Control (2013)  Target Ranges:  Prepandial:   less than 140 mg/dL      Peak postprandial:   less than 180 mg/dL (1-2 hours)      Critically ill patients:  140 - 180 mg/dL   Results for BENSEN, CHADDERDON (MRN [id number]) as of 11/08/2012 15:29  Ref. Range 11/07/2012 07:16 11/07/2012 11:07 11/07/2012 16:29 11/07/2012 21:48 11/08/2012 07:23 11/08/2012 11:37  Glucose-Capillary Latest Range: 70-99 mg/dL 409 (H) 811 (H) 914 (H) 118 (H) 131 (H) 217 (H)    Inpatient Diabetes Program Recommendations Insulin - Meal Coverage: Please consider increasing the meal coverage.  Recommend Novolog 6 units TID with meals. HgbA1C: Please consider ordering an A1C to determine blood glucose control over the past 2-3 months.  There are not any records of an A1C in the chart.  Note: Patient has a history of diabetes and takes Lantus 15 unit QHS and Glyburide 5mg  every morning at home for diabetes management.  Currently patient ordered to receive Lantus 20 units QHS, Novolog resistant correction ACHS, Novolog 4 units meal coverage TID, and Glyburide 5mg  every morning for inpatient glycemic control.  Fasting blood glucose this morning was 131 mg/dl.  It appears that blood glucose is more elevated prior to meals.  Please consider increasing meal coverage to Novolog 6 units TID with meals and ordering an A1C.  Will continue to follow.  Thanks, Orlando Penner, RN, BSN, CCRN Diabetes Coordinator Inpatient Diabetes Program [phone]

## 2012-11-08 NOTE — Progress Notes (Signed)
UR chart review completed.  

## 2012-11-08 NOTE — Progress Notes (Signed)
Subjective: Interval History: has no complaint of nausea or vomiting. Patient denies also any difficulty in breathing.  Objective: Vital signs in last 24 hours: Temp:  [98.9 F (37.2 C)-99.3 F (37.4 C)] 98.9 F (37.2 C) (01/13 0514) Pulse Rate:  [98-114] 99  (01/13 0656) Resp:  [18-20] 18  (01/13 0514) BP: (121-163)/(72-92) 146/92 mmHg (01/13 0656) SpO2:  [99 %-100 %] 100 % (01/13 0514) Weight change:   Intake/Output from previous day: 01/12 0701 - 01/13 0700 In: 3376.3 [P.O.:1560; I.V.:1816.3] Out: 3825 [Urine:3825] Intake/Output this shift:    General appearance: alert, cooperative and no distress Resp: clear to auscultation bilaterally Cardio: regular rate and rhythm, S1, S2 normal, no murmur, click, rub or gallop GI: soft, non-tender; bowel sounds normal; no masses,  no organomegaly Extremities: extremities normal, atraumatic, no cyanosis or edema  Lab Results:  [facility] 11/07/12 0608 11/06/12 1625  WBC 13.5* 15.3*  HGB 9.8* 11.0*  HCT 28.8* 32.3*  PLT 263 290   BMET:  Basename 11/08/12 0436 11/07/12 1249  NA 137 --  K 3.8 --  CL 106 --  CO2 21 --  GLUCOSE 154* --  BUN 23 --  CREATININE 2.49* --  CALCIUM 8.6 7.7*    Basename 11/07/12 1249  PTH PENDING   Iron Studies:  Basename 11/07/12 1249  IRON <10*  TIBC Not calculated due to Iron <10.  TRANSFERRIN --  FERRITIN 649*    Studies/Results: No results found.  I have reviewed the patient's current medications.  Assessment/Plan: Problem #1 renal failure his BUN is 3 creatinine is 2.49 function seems to be stable. Presently not sure whether this is acute or chronic renal failure. Workup at this moment is pending. Problem #2 hypertension his blood pressure seems to be controlled very well Problem #3 history of diabetes his random blood sugar seems to be stable. Problem #4 anemia presently patient with low iron saturation however his ferritin seems to be high hence most likely compression of iron  deficiency and anemia of chronic disease. Problem #5 history of  abscess patient on antibiotics presently is a febrile his white blood cell count is improving. Problem #6 metabolic bone disease his calcium is low and intact PTH is pending. Problem #7 history of retinopathy. Plan: We'll check his basic metabolic panel, phosphorus in the morning. We'll check his UA We'll check other blood work which has been done.    LOS: 2 days   Donald Sampson S 11/08/2012,8:03 AM

## 2012-11-08 NOTE — Progress Notes (Signed)
*  PRELIMINARY RESULTS* Echocardiogram 2D Echocardiogram has been performed.  Conrad Healdton 11/08/2012, 10:32 AM

## 2012-11-08 NOTE — Care Management Note (Signed)
    Page 1 of 1   11/10/2012     8:33:51 AM   CARE MANAGEMENT NOTE 11/10/2012  Patient:  RAKIN, LEMELLE   Account Number:  [id number]  Date Initiated:  11/08/2012  Documentation initiated by:  Sharrie Rothman  Subjective/Objective Assessment:   Pt admitted from home with cellulitis and abcess to neck. Pt lives with his parents and will return home at discharge. Pt is independent with ADLs'.     Action/Plan:   No CM or HH needs noted.   Anticipated DC Date:  11/10/2012   Anticipated DC Plan:  HOME/SELF CARE      DC Planning Services  CM consult      Choice offered to / List presented to:             Status of service:  Completed, signed off Medicare Important Message given?   (If response is "NO", the following Medicare IM given date fields will be blank) Date Medicare IM given:   Date Additional Medicare IM given:    Discharge Disposition:  HOME/SELF CARE  Per UR Regulation:    If discussed at Long Length of Stay Meetings, dates discussed:    Comments:  11/10/12 0830 Arlyss Queen, RN BSN CM Pt discharged home today. No CM or HH needs noted.  11/08/12 1032 Arlyss Queen, RN BSN CM

## 2012-11-08 NOTE — Progress Notes (Signed)
Subjective: Patient is resting. He is receiving IV antibiotic and wound care. No fever or chills.  Objective: Vital signs in last 24 hours: Temp:  [98.9 F (37.2 C)-99.3 F (37.4 C)] 98.9 F (37.2 C) (01/13 0514) Pulse Rate:  [98-114] 99  (01/13 0656) Resp:  [18-20] 18  (01/13 0514) BP: (121-163)/(72-92) 146/92 mmHg (01/13 0656) SpO2:  [99 %-100 %] 100 % (01/13 0514) Weight change:  Last BM Date: 11/06/12  Intake/Output from previous day: 01/12 0701 - 01/13 0700 In: 3376.3 [P.O.:1560; I.V.:1816.3] Out: 3825 [Urine:3825]  PHYSICAL EXAM General appearance: alert and no distress Resp: clear to auscultation bilaterally Cardio: S1, S2 normal GI: soft, non-tender; bowel sounds normal; no masses,  no organomegaly Extremities: extremities normal, atraumatic, no cyanosis or edema tender swelling on the posterior neck, I&D done. Dressing in place  Lab Results:    @labtest @ [facility] No results found for this basename: PHART,PCO2,PO2ART,TCO2,HCO3 in the last 72 hours CULTURES No results found for this or any previous visit (from the past 240 hour(s)). Studies/Results: No results found.  Medications: I have reviewed the patient's current medications.  Assesment: 1. Abscess and cellulitis of the posterior Neck 2. DM type poorly controlled 3. Hypertension 4. H/O glaucoma 5. Blind LT eye 5. CRF  Active Problems:  * No active hospital problems. *     Plan: -continue Iv antibiotics -continue wound care Nephrology consult appreciated We will monitor cbc/BMP.    LOS: 2 days   Gayle Collard 11/08/2012, 7:43 AM

## 2012-11-09 LAB — PROTEIN ELECTROPHORESIS, SERUM
Albumin ELP: 39.9 % — ABNORMAL LOW (ref 55.8–66.1)
Alpha-1-Globulin: 7.7 % — ABNORMAL HIGH (ref 2.9–4.9)
Alpha-2-Globulin: 20.4 % — ABNORMAL HIGH (ref 7.1–11.8)
Beta 2: 8.7 % — ABNORMAL HIGH (ref 3.2–6.5)
Beta Globulin: 5.7 % (ref 4.7–7.2)
Gamma Globulin: 17.6 % (ref 11.1–18.8)
M-Spike, %: NOT DETECTED g/dL
Total Protein ELP: 5.7 g/dL — ABNORMAL LOW (ref 6.0–8.3)

## 2012-11-09 LAB — CBC
HCT: 29.4 % — ABNORMAL LOW (ref 39.0–52.0)
Hemoglobin: 9.8 g/dL — ABNORMAL LOW (ref 13.0–17.0)
RDW: 13.3 % (ref 11.5–15.5)
WBC: 8.8 10*3/uL (ref 4.0–10.5)

## 2012-11-09 LAB — COMPLEMENT, TOTAL: Compl, Total (CH50): >60 U/mL — ABNORMAL HIGH (ref 31–60)

## 2012-11-09 LAB — GLUCOSE, CAPILLARY
Glucose-Capillary: 125 mg/dL — ABNORMAL HIGH (ref 70–99)
Glucose-Capillary: 186 mg/dL — ABNORMAL HIGH (ref 70–99)

## 2012-11-09 LAB — BASIC METABOLIC PANEL
Chloride: 104 mEq/L (ref 96–112)
Creatinine, Ser: 2.21 mg/dL — ABNORMAL HIGH (ref 0.50–1.35)
GFR calc Af Amer: 43 mL/min — ABNORMAL LOW (ref >90)
Potassium: 3.7 mEq/L (ref 3.5–5.1)

## 2012-11-09 MED ORDER — POLYSACCHARIDE IRON COMPLEX 150 MG PO CAPS
150.0000 mg | ORAL_CAPSULE | Freq: Two times a day (BID) | ORAL | Status: DC
  Administered 2012-11-09 (×2): 150 mg via ORAL
  Filled 2012-11-09 (×2): qty 1

## 2012-11-09 MED ORDER — VITAMIN D (ERGOCALCIFEROL) 1.25 MG (50000 UNIT) PO CAPS
50000.0000 [IU] | ORAL_CAPSULE | ORAL | Status: DC
  Administered 2012-11-09: 50000 [IU] via ORAL
  Filled 2012-11-09: qty 1

## 2012-11-09 NOTE — Progress Notes (Signed)
Subjective: Interval History: has no complaint of difficulty in breathing. Patient denies any nausea or vomiting. Overall is feeling better..  Objective: Vital signs in last 24 hours: Temp:  [98.3 F (36.8 C)-99.3 F (37.4 C)] 98.3 F (36.8 C) (01/14 0602) Pulse Rate:  [87-105] 87  (01/14 0602) Resp:  [18-20] 20  (01/14 0602) BP: (147-167)/(63-99) 161/83 mmHg (01/14 0602) SpO2:  [100 %] 100 % (01/14 0602) Weight change:   Intake/Output from previous day: 01/13 0701 - 01/14 0700 In: 3687.9 [P.O.:1440; I.V.:2247.9] Out: 1750 [Urine:1750] Intake/Output this shift:    General appearance: alert, cooperative and no distress Resp: clear to auscultation bilaterally Cardio: regular rate and rhythm, S1, S2 normal, no murmur, click, rub or gallop GI: soft, non-tender; bowel sounds normal; no masses,  no organomegaly Extremities: extremities normal, atraumatic, no cyanosis or edema  Lab Results:  [facility] 11/09/12 0506 11/07/12 0608  WBC 8.8 13.5*  HGB 9.8* 9.8*  HCT 29.4* 28.8*  PLT 310 263   BMET:  Basename 11/09/12 0506 11/08/12 0436  NA 137 137  K 3.7 3.8  CL 104 106  CO2 23 21  GLUCOSE 156* 154*  BUN 20 23  CREATININE 2.21* 2.49*  CALCIUM 8.6 8.6    Basename 11/07/12 1249  PTH 60.7   Iron Studies:  Basename 11/07/12 1249  IRON <10*  TIBC Not calculated due to Iron <10.  TRANSFERRIN --  FERRITIN 649*    Studies/Results: US Renal  11/08/2012  *RADIOLOGY REPORT*  Clinical Data: Chronic kidney disease with acute renal failure.  RENAL/URINARY TRACT ULTRASOUND COMPLETE  Comparison:  None.  Findings:  Right Kidney:  13.1 cm in length.  Normal renal cortical thickness and echogenicity.  Mild hydronephrosis.  No renal calculi.  Left Kidney:  13.2 cm in length. Normal renal cortical thickness and echogenicity without focal lesions or hydronephrosis.  Bladder:  Empty.  Incidental findings:  The gallbladder is filled with small gallstones and echogenic sludge.  IMPRESSION:   1.  Normal size and echogenicity of both kidneys with normal renal cortical thickness. 2.  Mild right-sided hydronephrosis. 3. Cholelithiasis and gallbladder sludge.   Original Report Authenticated By: Rudie Meyer, M.D.     I have reviewed the patient's current medications.  Assessment/Plan: Problem #1 renal failure most likely acute his BUN is 20 creatinine is 2.21 renal function seems to be improving. Ultrasound showed normal size kidneys with some mild hydro-on the right side. Presently ANA is negative, complement is normal hepatitis B surface antigen and hepatitis C antibody nonreactive. Problem #2 anemia most likely iron deficiency his H&H is a stable but low. Problem #3 hypertension his blood pressure is controlled very well Problem #4 diabetes Problem #5 vitamin D deficiency Problem #6 cellulitis of his neck presently is on antibiotics. Patient is a febrile and his white blood cell count is normal. Plan: We'll start patient on Ergocalciferol 50,000 international unit once a week Nu-Iron 150 mg by mouth twice a day We'll continue his other medications as before.    LOS: 3 days   Weslynn Ke S 11/09/2012,7:58 AM

## 2012-11-09 NOTE — Progress Notes (Signed)
ANTIBIOTIC CONSULT NOTE   Pharmacy Consult for Vancomycin Indication: abscess of posterior neck  No Known Allergies  Patient Measurements: Height: 6\' 1"  (185.4 cm) Weight: 268 lb 4.8 oz (121.7 kg) IBW/kg (Calculated) : 79.9   Vital Signs: Temp: 98.3 F (36.8 C) (01/14 0602) Temp src: Oral (01/14 0602) BP: 161/83 mmHg (01/14 0602) Pulse Rate: 87  (01/14 0602) Intake/Output from previous day: 01/13 0701 - 01/14 0700 In: 3687.9 [P.O.:1440; I.V.:2247.9] Out: 1750 [Urine:1750] Intake/Output from this shift:    Labs:  Basename 11/09/12 0506 11/08/12 1755 11/08/12 0436 11/07/12 0608 11/06/12 1625  WBC 8.8 -- -- 13.5* 15.3*  HGB 9.8* -- -- 9.8* 11.0*  PLT 310 -- -- 263 290  LABCREA -- 72.01 -- -- --  CREATININE 2.21* -- 2.49* 2.49* --   Estimated Creatinine Clearance: 63.7 ml/min (by C-G formula based on Cr of 2.21). No results found for this basename: VANCOTROUGH:2,VANCOPEAK:2,VANCORANDOM:2,GENTTROUGH:2,GENTPEAK:2,GENTRANDOM:2,TOBRATROUGH:2,TOBRAPEAK:2,TOBRARND:2,AMIKACINPEAK:2,AMIKACINTROU:2,AMIKACIN:2, in the last 72 hours   Microbiology: Recent Results (from the past 720 hour(s))  WOUND CULTURE     Status: Normal (Preliminary result)   Collection Time   11/06/12  6:31 PM      Component Value Range Status Comment   Specimen Description NECK   Final    Special Requests Normal   Final    Gram Stain     Final    Value: FEW WBC PRESENT,BOTH PMN AND MONONUCLEAR     NO SQUAMOUS EPITHELIAL CELLS SEEN     RARE GRAM POSITIVE COCCI     IN PAIRS   Culture     Final    Value: ABUNDANT STAPHYLOCOCCUS AUREUS     Note: RIFAMPIN AND GENTAMICIN SHOULD NOT BE USED AS SINGLE DRUGS FOR TREATMENT OF STAPH INFECTIONS.   Report Status PENDING   Incomplete     Medical History: Past Medical History  Diagnosis Date  . Diabetes mellitus   . Hypertension   . Glaucoma (increased eye pressure) 2011    Medications:  Scheduled:     . amLODipine  10 mg Oral Daily  . dorzolamide  1 drop  Left Eye BID   And  . timolol  1 drop Left Eye BID  . enoxaparin (LOVENOX) injection  40 mg Subcutaneous Q24H  . glyBURIDE  5 mg Oral Q breakfast  . insulin aspart  0-20 Units Subcutaneous TID WC  . insulin aspart  0-5 Units Subcutaneous QHS  . insulin aspart  4 Units Subcutaneous TID WC  . insulin glargine  20 Units Subcutaneous QHS  . iron polysaccharides  150 mg Oral BID  . vancomycin  1,750 mg Intravenous Q24H  . Vitamin D (Ergocalciferol)  50,000 Units Oral Q7 days  . [DISCONTINUED] enoxaparin (LOVENOX) injection  30 mg Subcutaneous Q24H   Assessment: 37 yo obese M with hx of DM and CRI presents with abscess on back of neck x 4 days.  Received Vancomycin 1gm in ED on admission. Renal function is at patient's baseline.   Goal of Therapy:  Vancomycin trough level 15-20 mcg/ml  Plan:  1) Vancomycin 1gm IV x1 now (for total 2gm loading dose) 2) Vancomycin 1750mg  IV q24h 3) Check Vancomycin trough at steady state 4) Monitor renal function and cx data   Valrie Hart A 11/09/2012,8:45 AM

## 2012-11-09 NOTE — Progress Notes (Signed)
Subjective: Patient is feeling better. His cellulitis and abscess is improving. He is receiving Iv antibiotics.  Objective: Vital signs in last 24 hours: Temp:  [98.3 F (36.8 C)-99.3 F (37.4 C)] 98.3 F (36.8 C) (01/14 0602) Pulse Rate:  [87-105] 87  (01/14 0602) Resp:  [18-20] 20  (01/14 0602) BP: (147-167)/(63-99) 161/83 mmHg (01/14 0602) SpO2:  [100 %] 100 % (01/14 0602) Weight change:  Last BM Date: 11/06/12  Intake/Output from previous day: 01/13 0701 - 01/14 0700 In: 3687.9 [P.O.:1440; I.V.:2247.9] Out: 1750 [Urine:1750]  PHYSICAL EXAM General appearance: alert and no distress Resp: clear to auscultation bilaterally Cardio: S1, S2 normal GI: soft, non-tender; bowel sounds normal; no masses,  no organomegaly Extremities: extremities normal, atraumatic, no cyanosis or edema tender swelling on the posterior neck, I&D done. Dressing in place  Lab Results:    @labtest @ [facility] No results found for this basename: PHART,PCO2,PO2ART,TCO2,HCO3 in the last 72 hours CULTURES Recent Results (from the past 240 hour(s))  WOUND CULTURE     Status: Normal (Preliminary result)   Collection Time   11/06/12  6:31 PM      Component Value Range Status Comment   Specimen Description NECK   Final    Special Requests Normal   Final    Gram Stain     Final    Value: FEW WBC PRESENT,BOTH PMN AND MONONUCLEAR     NO SQUAMOUS EPITHELIAL CELLS SEEN     RARE GRAM POSITIVE COCCI     IN PAIRS   Culture Culture reincubated for better growth   Final    Report Status PENDING   Incomplete    Studies/Results: US Renal  11/08/2012  *RADIOLOGY REPORT*  Clinical Data: Chronic kidney disease with acute renal failure.  RENAL/URINARY TRACT ULTRASOUND COMPLETE  Comparison:  None.  Findings:  Right Kidney:  13.1 cm in length.  Normal renal cortical thickness and echogenicity.  Mild hydronephrosis.  No renal calculi.  Left Kidney:  13.2 cm in length. Normal renal cortical thickness and echogenicity  without focal lesions or hydronephrosis.  Bladder:  Empty.  Incidental findings:  The gallbladder is filled with small gallstones and echogenic sludge.  IMPRESSION:  1.  Normal size and echogenicity of both kidneys with normal renal cortical thickness. 2.  Mild right-sided hydronephrosis. 3. Cholelithiasis and gallbladder sludge.   Original Report Authenticated By: Rudie Meyer, M.D.     Medications: I have reviewed the patient's current medications.  Assesment: 1. Abscess and cellulitis of the posterior Neck 2. DM type poorly controlled 3. Hypertension 4. H/O glaucoma 5. Blind LT eye 5. CRF  Active Problems:  * No active hospital problems. *     Plan: -continue Iv antibiotics -continue wound care We will monitor cbc/BMP.    LOS: 3 days   Dashiel Bergquist 11/09/2012, 7:49 AM

## 2012-11-10 LAB — WOUND CULTURE

## 2012-11-10 LAB — BASIC METABOLIC PANEL
CO2: 25 mEq/L (ref 19–32)
Calcium: 8.3 mg/dL — ABNORMAL LOW (ref 8.4–10.5)
Creatinine, Ser: 2.12 mg/dL — ABNORMAL HIGH (ref 0.50–1.35)
GFR calc non Af Amer: 39 mL/min — ABNORMAL LOW (ref >90)
Glucose, Bld: 166 mg/dL — ABNORMAL HIGH (ref 70–99)

## 2012-11-10 LAB — UIFE/LIGHT CHAINS/TP QN, 24-HR UR
Albumin, U: DETECTED
Alpha 1, Urine: DETECTED — AB
Alpha 2, Urine: DETECTED — AB
Beta, Urine: DETECTED — AB
Free Kappa Lt Chains,Ur: 40.1 mg/dL — ABNORMAL HIGH (ref 0.14–2.42)
Free Kappa/Lambda Ratio: 5.67 ratio (ref 2.04–10.37)
Free Lambda Excretion/Day: 208.57 mg/d
Free Lambda Lt Chains,Ur: 7.07 mg/dL — ABNORMAL HIGH (ref 0.02–0.67)
Free Lt Chn Excr Rate: 1182.95 mg/d
Gamma Globulin, Urine: DETECTED — AB
Time: 24 hours
Total Protein, Urine-Ur/day: 5236 mg/d — ABNORMAL HIGH (ref 10–140)
Total Protein, Urine: 177.5 mg/dL
Volume, Urine: 2950 mL

## 2012-11-10 MED ORDER — SULFAMETHOXAZOLE-TRIMETHOPRIM 800-160 MG PO TABS: 1.0000 | ORAL_TABLET | Freq: Two times a day (BID) | ORAL | Status: DC
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
## 2012-11-10 NOTE — Progress Notes (Signed)
Patient received discharge instructions along with follow up appointments and prescriptions. Patient verbalized understanding of all instructions. Patient was escorted by staff via wheelchair to vehicle. Patient discharged to home in stable condition. 

## 2012-11-10 NOTE — Progress Notes (Signed)
Donald Sampson  MRN: [id number]  DOB/AGE: 1976/04/27 37 y.o.  Primary Care Physician:FANTA,TESFAYE, MD  Admit date: 11/06/2012  Chief Complaint:  Chief Complaint  Patient presents with  . Eye Problem    pressure check for lt eye  . Abscess    back of neck    S-Pt presented on  11/06/2012 with  Chief Complaint  Patient presents with  . Eye Problem    pressure check for lt eye  . Abscess    back of neck  .    Pt today feels better      . amLODipine  10 mg Oral Daily  . dorzolamide  1 drop Left Eye BID   And  . timolol  1 drop Left Eye BID  . enoxaparin (LOVENOX) injection  40 mg Subcutaneous Q24H  . glyBURIDE  5 mg Oral Q breakfast  . insulin aspart  0-20 Units Subcutaneous TID WC  . insulin aspart  0-5 Units Subcutaneous QHS  . insulin aspart  4 Units Subcutaneous TID WC  . insulin glargine  20 Units Subcutaneous QHS  . iron polysaccharides  150 mg Oral BID  . vancomycin  1,750 mg Intravenous Q24H  . Vitamin D (Ergocalciferol)  50,000 Units Oral Q7 days        Physical Exam: Vital signs in last 24 hours: Temp:  [97.9 F (36.6 C)-99 F (37.2 C)] 97.9 F (36.6 C) (01/15 0459) Pulse Rate:  [91-106] 91  (01/15 0459) Resp:  [18-20] 20  (01/15 0459) BP: (142-166)/(70-98) 152/79 mmHg (01/15 0459) SpO2:  [100 %] 100 % (01/15 0459) Weight change:  Last BM Date: 11/10/12  Intake/Output from previous day: 01/14 0701 - 01/15 0700 In: 3540 [P.O.:840; I.V.:1200; IV Piggyback:1500] Out: 1075 [Urine:1075] Total I/O In: 240 [P.O.:240] Out: -    Physical Exam: General- pt is awake,alert, oriented to time place and person Resp- No acute REsp distress, CTA B/L NO Rhonchi CVS- S1S2 regular ij rate and rhythm GIT- BS+, soft, NT, ND EXT- NO LE Edema, Cyanosis   Lab Results: CBC  Basename 11/09/12 0506  WBC 8.8  HGB 9.8*  HCT 29.4*  PLT 310    BMET  Basename 11/10/12 0437 11/09/12 0506  NA 137 137  K 3.7 3.7  CL 105 104  CO2 25 23  GLUCOSE 166*  156*  BUN 19 20  CREATININE 2.12* 2.21*  CALCIUM 8.3* 8.6    MICRO Recent Results (from the past 240 hour(s))  WOUND CULTURE     Status: Normal (Preliminary result)   Collection Time   11/06/12  6:31 PM      Component Value Range Status Comment   Specimen Description NECK   Final    Special Requests Normal   Final    Gram Stain     Final    Value: FEW WBC PRESENT,BOTH PMN AND MONONUCLEAR     NO SQUAMOUS EPITHELIAL CELLS SEEN     RARE GRAM POSITIVE COCCI     IN PAIRS   Culture     Final    Value: ABUNDANT STAPHYLOCOCCUS AUREUS     Note: RIFAMPIN AND GENTAMICIN SHOULD NOT BE USED AS SINGLE DRUGS FOR TREATMENT OF STAPH INFECTIONS.   Report Status PENDING   Incomplete       Lab Results  Component Value Date   PTH 60.7 11/07/2012   CALCIUM 8.3* 11/10/2012   PHOS 4.0 11/09/2012      Renal US  Findings:  Right Kidney: 13.1 cm in length.  Normal renal cortical thickness  and echogenicity. Mild hydronephrosis. No renal calculi.  Left Kidney: 13.2 cm in length. Normal renal cortical thickness  and echogenicity without focal lesions or hydronephrosis.  Bladder: Empty.  Incidental findings: The gallbladder is filled with small  gallstones and echogenic sludge.  IMPRESSION:  1. Normal size and echogenicity of both kidneys with normal renal  cortical thickness.  2. Mild right-sided hydronephrosis.  3. Cholelithiasis and gallbladder sludge.     Results for CLEOTIS, SPARR (MRN [id number]) as of 11/10/2012 09:13  Ref. Range 11/07/2012 12:49  ANA Latest Range: NEGATIVE  NEGATIVE  ds DNA Ab Latest Range: <30 IU/mL 1  GBM Ab Latest Range: <20 AU/mL <1  Myeloperoxidase Abs Latest Range: <20 AU/mL <1  Serine Protease 3 Latest Range: <20 AU/mL 1  c-ANCA Screen Latest Range: NEGATIVE  NEGATIVE  p-ANCA Screen Latest Range: NEGATIVE  NEGATIVE  Atypical p-ANCA Screen Latest Range: NEGATIVE  NEGATIVE  C3 Complement Latest Range: 90-180 mg/dL 150  Compl, Total (CH50) Latest Range:  31-60 U/mL >60 (H)  Complement C4, Body Fluid Latest Range: 10-40 mg/dL 31    The possibility of a faint restricted band(s) cannot be completely excluded in the BETA-2 region. Suggest serum IFE if clinically indicated.   Results for HELDER, CRISAFULLI (MRN [id number]) as of 11/10/2012 10:02  Ref. Range 11/07/2012 12:49  Iron Latest Range: 42-135 ug/dL <10 (L)  UIBC Latest Range: 125-400 ug/dL 184  TIBC Latest Range: 215-435 ug/dL Not calculated due to Iron <10.  Saturation Ratios Latest Range: 20-55 % Not calculated due to Iron <10.  Ferritin Latest Range: 22-322 ng/mL 649 (H)  Folate No range found 3.7    Impression: 1)Renal  AKI secondary to SIRS  CKD stage 3b/ 4 .  Not sure about CKD duration as data is less than 3 months old .  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   5)ID admitted with Cellulits On Bactrim  6)FEN  Normokalemic  Hyponatremic ==> now at goal Uncontrolled DM + AKI + Hypovolemia   7)Acid base  Co2 at goal   Plan:  Pt will benefit from urology consult Pt will benefit from haem/onocology consult Pt will need outpt nephrology follow up-will follow  Pt will need GI input for iron deficiency       Donald Sampson S 11/10/2012, 9:13 AM

## 2012-11-11 LAB — IMMUNOFIXATION ELECTROPHORESIS
IgA: 440 mg/dL — ABNORMAL HIGH (ref 68–379)
IgG (Immunoglobin G), Serum: 1250 mg/dL (ref 650–1600)
IgM, Serum: 85 mg/dL (ref 41–251)
Total Protein ELP: 6.1 g/dL (ref 6.0–8.3)

## 2013-01-04 ENCOUNTER — Ambulatory Visit (INDEPENDENT_AMBULATORY_CARE_PROVIDER_SITE_OTHER): Payer: Medicaid Other | Admitting: Urology

## 2013-01-04 ENCOUNTER — Other Ambulatory Visit: Payer: Self-pay | Admitting: Urology

## 2013-01-04 DIAGNOSIS — N529 Male erectile dysfunction, unspecified: Secondary | ICD-10-CM

## 2013-01-04 DIAGNOSIS — N1330 Unspecified hydronephrosis: Secondary | ICD-10-CM

## 2013-01-04 DIAGNOSIS — N200 Calculus of kidney: Secondary | ICD-10-CM

## 2013-01-05 ENCOUNTER — Ambulatory Visit (HOSPITAL_COMMUNITY): Payer: Medicaid Other | Admitting: Oncology

## 2013-01-05 ENCOUNTER — Encounter (HOSPITAL_COMMUNITY): Payer: Self-pay | Admitting: Oncology

## 2013-01-13 ENCOUNTER — Ambulatory Visit (HOSPITAL_COMMUNITY): Payer: Medicaid Other

## 2013-01-14 ENCOUNTER — Other Ambulatory Visit (HOSPITAL_COMMUNITY): Payer: Medicaid Other

## 2013-01-17 ENCOUNTER — Ambulatory Visit (HOSPITAL_COMMUNITY): Admission: RE | Admit: 2013-01-17 | Payer: Medicaid Other | Source: Ambulatory Visit

## 2013-01-21 ENCOUNTER — Encounter (HOSPITAL_COMMUNITY): Payer: Self-pay

## 2013-01-21 ENCOUNTER — Ambulatory Visit (HOSPITAL_COMMUNITY)
Admission: RE | Admit: 2013-01-21 | Discharge: 2013-01-21 | Disposition: A | Discharge status: Home / Self Care | Payer: Medicaid Other | Source: Ambulatory Visit | Attending: Urology | Admitting: Urology

## 2013-01-21 DIAGNOSIS — I10 Essential (primary) hypertension: Secondary | ICD-10-CM | POA: Insufficient documentation

## 2013-01-21 DIAGNOSIS — N1330 Unspecified hydronephrosis: Secondary | ICD-10-CM | POA: Insufficient documentation

## 2013-01-21 DIAGNOSIS — E119 Type 2 diabetes mellitus without complications: Secondary | ICD-10-CM | POA: Insufficient documentation

## 2013-02-01 ENCOUNTER — Ambulatory Visit (INDEPENDENT_AMBULATORY_CARE_PROVIDER_SITE_OTHER): Payer: Medicaid Other | Admitting: Urology

## 2013-02-01 DIAGNOSIS — N1330 Unspecified hydronephrosis: Secondary | ICD-10-CM

## 2013-02-02 ENCOUNTER — Other Ambulatory Visit: Payer: Self-pay | Admitting: Urology

## 2013-02-02 DIAGNOSIS — N1330 Unspecified hydronephrosis: Secondary | ICD-10-CM

## 2013-02-10 ENCOUNTER — Encounter (HOSPITAL_COMMUNITY)
Admission: RE | Admit: 2013-02-10 | Discharge: 2013-02-10 | Disposition: A | Discharge status: Home / Self Care | Payer: Medicaid Other | Source: Ambulatory Visit | Attending: Urology | Admitting: Urology

## 2013-02-10 DIAGNOSIS — N1330 Unspecified hydronephrosis: Secondary | ICD-10-CM

## 2013-02-10 DIAGNOSIS — N2889 Other specified disorders of kidney and ureter: Secondary | ICD-10-CM | POA: Insufficient documentation

## 2013-02-10 MED ORDER — FUROSEMIDE 10 MG/ML IJ SOLN
60.0000 mg | Freq: Once | INTRAMUSCULAR | Status: AC
  Administered 2013-02-10: 60 mg via INTRAVENOUS
  Filled 2013-02-10: qty 6

## 2013-02-10 MED ORDER — TECHNETIUM TC 99M MERTIATIDE
15.0000 | Freq: Once | INTRAVENOUS | Status: AC | PRN
  Administered 2013-02-10: 15 via INTRAVENOUS

## 2013-02-10 MED ORDER — FUROSEMIDE 10 MG/ML IJ SOLN: 40.0000 mg | Freq: Once | INTRAMUSCULAR | Status: DC

## 2013-03-29 ENCOUNTER — Ambulatory Visit: Payer: Medicaid Other | Admitting: Urology

## 2013-03-31 ENCOUNTER — Ambulatory Visit (HOSPITAL_COMMUNITY): Payer: Medicaid Other

## 2013-04-06 ENCOUNTER — Encounter (HOSPITAL_COMMUNITY): Payer: Self-pay

## 2013-04-06 ENCOUNTER — Encounter (HOSPITAL_COMMUNITY): Payer: Medicaid Other | Attending: Hematology and Oncology

## 2013-04-06 VITALS — BP 147/85 | HR 90 | Temp 98.6°F | Resp 18 | Ht 73.0 in | Wt 291.3 lb

## 2013-04-06 DIAGNOSIS — I10 Essential (primary) hypertension: Secondary | ICD-10-CM

## 2013-04-06 DIAGNOSIS — N189 Chronic kidney disease, unspecified: Secondary | ICD-10-CM | POA: Insufficient documentation

## 2013-04-06 DIAGNOSIS — I129 Hypertensive chronic kidney disease with stage 1 through stage 4 chronic kidney disease, or unspecified chronic kidney disease: Secondary | ICD-10-CM | POA: Insufficient documentation

## 2013-04-06 DIAGNOSIS — R894 Abnormal immunological findings in specimens from other organs, systems and tissues: Secondary | ICD-10-CM

## 2013-04-06 DIAGNOSIS — R809 Proteinuria, unspecified: Secondary | ICD-10-CM

## 2013-04-06 DIAGNOSIS — E119 Type 2 diabetes mellitus without complications: Secondary | ICD-10-CM | POA: Insufficient documentation

## 2013-04-06 LAB — CBC WITH DIFFERENTIAL/PLATELET
Basophils Absolute: 0 10*3/uL (ref 0.0–0.1)
Basophils Relative: 0 % (ref 0–1)
Eosinophils Relative: 3 % (ref 0–5)
HCT: 30.2 % — ABNORMAL LOW (ref 39.0–52.0)
Hemoglobin: 10.1 g/dL — ABNORMAL LOW (ref 13.0–17.0)
Lymphocytes Relative: 16 % (ref 12–46)
Monocytes Relative: 16 % — ABNORMAL HIGH (ref 3–12)
Neutro Abs: 3.2 10*3/uL (ref 1.7–7.7)
RBC: 4.09 MIL/uL — ABNORMAL LOW (ref 4.22–5.81)
WBC: 5 10*3/uL (ref 4.0–10.5)

## 2013-04-06 LAB — COMPREHENSIVE METABOLIC PANEL
AST: 23 U/L (ref 0–37)
BUN: 45 mg/dL — ABNORMAL HIGH (ref 6–23)
CO2: 28 mEq/L (ref 19–32)
Chloride: 93 mEq/L — ABNORMAL LOW (ref 96–112)
Creatinine, Ser: 3.42 mg/dL — ABNORMAL HIGH (ref 0.50–1.35)
GFR calc non Af Amer: 22 mL/min — ABNORMAL LOW (ref >90)
Glucose, Bld: 222 mg/dL — ABNORMAL HIGH (ref 70–99)
Total Bilirubin: 0.1 mg/dL — ABNORMAL LOW (ref 0.3–1.2)
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
## 2013-04-06 NOTE — Patient Instructions (Addendum)
.  [facility] Discharge Instructions  RECOMMENDATIONS MADE BY THE CONSULTANT AND ANY TEST RESULTS WILL BE SENT TO YOUR REFERRING PHYSICIAN.  EXAM FINDINGS BY THE PHYSICIAN TODAY AND SIGNS OR SYMPTOMS TO REPORT TO CLINIC OR PRIMARY PHYSICIAN:  This is probably just a lab abnormality We will recheck your blood work   INSTRUCTIONS GIVEN AND DISCUSSED: Work on keeping your diabetes and blood pressure under control. This is what will help you most and save your kidneys  SPECIAL INSTRUCTIONS/FOLLOW-UP: 2 weeks  Thank you for choosing [facility] to provide your oncology and hematology care.  To afford each patient quality time with our providers, please arrive at least 15 minutes before your scheduled appointment time.  With your help, our goal is to use those 15 minutes to complete the necessary work-up to ensure our physicians have the information they need to help with your evaluation and healthcare recommendations.    Effective January 1st, 2014, we ask that you re-schedule your appointment with our physicians should you arrive 10 or more minutes late for your appointment.  We strive to give you quality time with our providers, and arriving late affects you and other patients whose appointments are after yours.    Again, thank you for choosing [facility].  Our hope is that these requests will decrease the amount of time that you wait before being seen by our physicians.       _____________________________________________________________  Should you have questions after your visit to [facility], please contact our office at [phone] between the hours of 8:30 a.m. and 5:00 p.m.  Voicemails left after 4:30 p.m. will not be returned until the following business day.  For prescription refill requests, have your pharmacy contact our office with your prescription refill request.

## 2013-04-07 LAB — KAPPA/LAMBDA LIGHT CHAINS
Kappa free light chain: 7.8 mg/dL — ABNORMAL HIGH (ref 0.33–1.94)
Kappa, lambda light chain ratio: 1.63 (ref 0.26–1.65)
Lambda free light chains: 4.79 mg/dL — ABNORMAL HIGH (ref 0.57–2.63)

## 2013-04-11 LAB — MULTIPLE MYELOMA PANEL, SERUM
Alpha-1-Globulin: 4.5 % (ref 2.9–4.9)
Alpha-2-Globulin: 15 % — ABNORMAL HIGH (ref 7.1–11.8)
Beta 2: 7 % — ABNORMAL HIGH (ref 3.2–6.5)
Gamma Globulin: 19.7 % — ABNORMAL HIGH (ref 11.1–18.8)
M-Spike, %: NOT DETECTED g/dL

## 2013-04-23 ENCOUNTER — Emergency Department (HOSPITAL_COMMUNITY)
Admission: EM | Admit: 2013-04-23 | Discharge: 2013-04-23 | Disposition: A | Discharge status: Home / Self Care | Payer: Medicaid Other | Attending: Emergency Medicine | Admitting: Emergency Medicine

## 2013-04-23 ENCOUNTER — Encounter (HOSPITAL_COMMUNITY): Payer: Self-pay | Admitting: *Deleted

## 2013-04-23 DIAGNOSIS — Z79899 Other long term (current) drug therapy: Secondary | ICD-10-CM | POA: Insufficient documentation

## 2013-04-23 DIAGNOSIS — X19XXXA Contact with other heat and hot substances, initial encounter: Secondary | ICD-10-CM | POA: Insufficient documentation

## 2013-04-23 DIAGNOSIS — M7989 Other specified soft tissue disorders: Secondary | ICD-10-CM | POA: Insufficient documentation

## 2013-04-23 DIAGNOSIS — J3489 Other specified disorders of nose and nasal sinuses: Secondary | ICD-10-CM | POA: Insufficient documentation

## 2013-04-23 DIAGNOSIS — I10 Essential (primary) hypertension: Secondary | ICD-10-CM | POA: Insufficient documentation

## 2013-04-23 DIAGNOSIS — H409 Unspecified glaucoma: Secondary | ICD-10-CM | POA: Insufficient documentation

## 2013-04-23 DIAGNOSIS — Y9301 Activity, walking, marching and hiking: Secondary | ICD-10-CM | POA: Insufficient documentation

## 2013-04-23 DIAGNOSIS — Z794 Long term (current) use of insulin: Secondary | ICD-10-CM | POA: Insufficient documentation

## 2013-04-23 DIAGNOSIS — Z23 Encounter for immunization: Secondary | ICD-10-CM | POA: Insufficient documentation

## 2013-04-23 DIAGNOSIS — Y9289 Other specified places as the place of occurrence of the external cause: Secondary | ICD-10-CM | POA: Insufficient documentation

## 2013-04-23 DIAGNOSIS — R059 Cough, unspecified: Secondary | ICD-10-CM | POA: Insufficient documentation

## 2013-04-23 DIAGNOSIS — E119 Type 2 diabetes mellitus without complications: Secondary | ICD-10-CM | POA: Insufficient documentation

## 2013-04-23 DIAGNOSIS — R05 Cough: Secondary | ICD-10-CM | POA: Insufficient documentation

## 2013-04-23 DIAGNOSIS — T25222A Burn of second degree of left foot, initial encounter: Secondary | ICD-10-CM

## 2013-04-23 DIAGNOSIS — T25221A Burn of second degree of right foot, initial encounter: Secondary | ICD-10-CM

## 2013-04-23 DIAGNOSIS — T25229A Burn of second degree of unspecified foot, initial encounter: Secondary | ICD-10-CM | POA: Insufficient documentation

## 2013-04-23 MED ORDER — BACITRACIN ZINC 500 UNIT/GM EX OINT
TOPICAL_OINTMENT | CUTANEOUS | Status: AC
  Administered 2013-04-23: 13:00:00
  Filled 2013-04-23: qty 2.7

## 2013-04-23 MED ORDER — TETANUS-DIPHTH-ACELL PERTUSSIS 5-2.5-18.5 LF-MCG/0.5 IM SUSP
0.5000 mL | Freq: Once | INTRAMUSCULAR | Status: AC
  Administered 2013-04-23: 0.5 mL via INTRAMUSCULAR
  Filled 2013-04-23: qty 0.5

## 2013-04-23 MED ORDER — HYDROCODONE-ACETAMINOPHEN 5-325 MG PO TABS: 1.0000 | ORAL_TABLET | Freq: Four times a day (QID) | ORAL | Status: DC | PRN

## 2013-04-23 NOTE — ED Notes (Signed)
MD at bedside. 
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
## 2013-04-25 ENCOUNTER — Encounter (HOSPITAL_COMMUNITY): Payer: Self-pay | Admitting: Oncology

## 2013-04-25 DIAGNOSIS — D509 Iron deficiency anemia, unspecified: Secondary | ICD-10-CM | POA: Insufficient documentation

## 2013-04-25 DIAGNOSIS — D890 Polyclonal hypergammaglobulinemia: Secondary | ICD-10-CM | POA: Insufficient documentation

## 2013-04-25 HISTORY — DX: Iron deficiency anemia, unspecified: D50.9

## 2013-04-25 HISTORY — DX: Polyclonal hypergammaglobulinemia: D89.0

## 2013-04-25 NOTE — Progress Notes (Signed)
Rescheduled This encounter was created in error - please disregard. 

## 2013-04-26 ENCOUNTER — Ambulatory Visit (HOSPITAL_COMMUNITY): Payer: Medicaid Other | Admitting: Oncology

## 2013-05-04 ENCOUNTER — Inpatient Hospital Stay (HOSPITAL_COMMUNITY)
Admission: EM | Admit: 2013-05-04 | Discharge: 2013-05-07 | DRG: 574 | Disposition: A | Discharge status: Home Health | Payer: Medicaid Other | Attending: Internal Medicine | Admitting: Internal Medicine

## 2013-05-04 ENCOUNTER — Encounter (HOSPITAL_COMMUNITY): Payer: Self-pay

## 2013-05-04 ENCOUNTER — Emergency Department (HOSPITAL_COMMUNITY): Payer: Medicaid Other

## 2013-05-04 DIAGNOSIS — L0390 Cellulitis, unspecified: Secondary | ICD-10-CM

## 2013-05-04 DIAGNOSIS — E119 Type 2 diabetes mellitus without complications: Secondary | ICD-10-CM

## 2013-05-04 DIAGNOSIS — I10 Essential (primary) hypertension: Secondary | ICD-10-CM

## 2013-05-04 DIAGNOSIS — H409 Unspecified glaucoma: Secondary | ICD-10-CM | POA: Diagnosis present

## 2013-05-04 DIAGNOSIS — E1142 Type 2 diabetes mellitus with diabetic polyneuropathy: Secondary | ICD-10-CM | POA: Diagnosis present

## 2013-05-04 DIAGNOSIS — L02619 Cutaneous abscess of unspecified foot: Principal | ICD-10-CM | POA: Diagnosis present

## 2013-05-04 DIAGNOSIS — N179 Acute kidney failure, unspecified: Secondary | ICD-10-CM

## 2013-05-04 DIAGNOSIS — E878 Other disorders of electrolyte and fluid balance, not elsewhere classified: Secondary | ICD-10-CM | POA: Diagnosis present

## 2013-05-04 DIAGNOSIS — N182 Chronic kidney disease, stage 2 (mild): Secondary | ICD-10-CM

## 2013-05-04 DIAGNOSIS — E871 Hypo-osmolality and hyponatremia: Secondary | ICD-10-CM | POA: Diagnosis present

## 2013-05-04 DIAGNOSIS — D890 Polyclonal hypergammaglobulinemia: Secondary | ICD-10-CM | POA: Diagnosis present

## 2013-05-04 DIAGNOSIS — H5440 Blindness, one eye, unspecified eye: Secondary | ICD-10-CM | POA: Diagnosis present

## 2013-05-04 DIAGNOSIS — N189 Chronic kidney disease, unspecified: Secondary | ICD-10-CM

## 2013-05-04 DIAGNOSIS — D509 Iron deficiency anemia, unspecified: Secondary | ICD-10-CM | POA: Diagnosis present

## 2013-05-04 DIAGNOSIS — I129 Hypertensive chronic kidney disease with stage 1 through stage 4 chronic kidney disease, or unspecified chronic kidney disease: Secondary | ICD-10-CM | POA: Diagnosis present

## 2013-05-04 DIAGNOSIS — D72829 Elevated white blood cell count, unspecified: Secondary | ICD-10-CM | POA: Diagnosis present

## 2013-05-04 DIAGNOSIS — L0291 Cutaneous abscess, unspecified: Secondary | ICD-10-CM

## 2013-05-04 DIAGNOSIS — E1149 Type 2 diabetes mellitus with other diabetic neurological complication: Secondary | ICD-10-CM | POA: Diagnosis present

## 2013-05-04 LAB — CBC WITH DIFFERENTIAL/PLATELET
Basophils Absolute: 0.1 10*3/uL (ref 0.0–0.1)
Basophils Relative: 0 % (ref 0–1)
Eosinophils Relative: 2 % (ref 0–5)
HCT: 24.7 % — ABNORMAL LOW (ref 39.0–52.0)
Hemoglobin: 8.5 g/dL — ABNORMAL LOW (ref 13.0–17.0)
MCH: 24.8 pg — ABNORMAL LOW (ref 26.0–34.0)
MCHC: 34.4 g/dL (ref 30.0–36.0)
MCV: 72 fL — ABNORMAL LOW (ref 78.0–100.0)
Monocytes Absolute: 1.4 10*3/uL — ABNORMAL HIGH (ref 0.1–1.0)
Monocytes Relative: 9 % (ref 3–12)
Neutro Abs: 11.8 10*3/uL — ABNORMAL HIGH (ref 1.7–7.7)
RDW: 12.9 % (ref 11.5–15.5)

## 2013-05-04 LAB — GLUCOSE, CAPILLARY: Glucose-Capillary: 185 mg/dL — ABNORMAL HIGH (ref 70–99)

## 2013-05-04 LAB — BASIC METABOLIC PANEL
BUN: 51 mg/dL — ABNORMAL HIGH (ref 6–23)
Chloride: 93 mEq/L — ABNORMAL LOW (ref 96–112)
Creatinine, Ser: 4.41 mg/dL — ABNORMAL HIGH (ref 0.50–1.35)
GFR calc Af Amer: 18 mL/min — ABNORMAL LOW (ref >90)
GFR calc non Af Amer: 16 mL/min — ABNORMAL LOW (ref >90)
Glucose, Bld: 312 mg/dL — ABNORMAL HIGH (ref 70–99)
Potassium: 4 mEq/L (ref 3.5–5.1)

## 2013-05-04 LAB — MRSA PCR SCREENING: MRSA by PCR: NEGATIVE

## 2013-05-04 MED ORDER — BRIMONIDINE TARTRATE 0.2 % OP SOLN
OPHTHALMIC | Status: AC
  Filled 2013-05-04: qty 5

## 2013-05-04 MED ORDER — BRIMONIDINE TARTRATE 0.2 % OP SOLN
1.0000 [drp] | Freq: Three times a day (TID) | OPHTHALMIC | Status: DC
  Administered 2013-05-04 – 2013-05-07 (×9): 1 [drp] via OPHTHALMIC
  Filled 2013-05-04: qty 5

## 2013-05-04 MED ORDER — AMLODIPINE BESYLATE 5 MG PO TABS
5.0000 mg | ORAL_TABLET | Freq: Every day | ORAL | Status: DC
  Administered 2013-05-05: 5 mg via ORAL
  Filled 2013-05-04 (×2): qty 1

## 2013-05-04 MED ORDER — LEVOFLOXACIN IN D5W 750 MG/150ML IV SOLN
750.0000 mg | Freq: Once | INTRAVENOUS | Status: AC
  Administered 2013-05-04: 750 mg via INTRAVENOUS
  Filled 2013-05-04: qty 150

## 2013-05-04 MED ORDER — ALUM & MAG HYDROXIDE-SIMETH 200-200-20 MG/5ML PO SUSP: 30.0000 mL | Freq: Four times a day (QID) | ORAL | Status: DC | PRN

## 2013-05-04 MED ORDER — ONDANSETRON HCL 4 MG PO TABS: 4.0000 mg | ORAL_TABLET | Freq: Four times a day (QID) | ORAL | Status: DC | PRN

## 2013-05-04 MED ORDER — SILVER SULFADIAZINE 1 % EX CREA
1.0000 "application " | TOPICAL_CREAM | Freq: Two times a day (BID) | CUTANEOUS | Status: DC
  Administered 2013-05-04: 15:00:00 via TOPICAL
  Administered 2013-05-05 – 2013-05-07 (×5): 1 via TOPICAL
  Filled 2013-05-04: qty 85

## 2013-05-04 MED ORDER — HEPARIN SODIUM (PORCINE) 5000 UNIT/ML IJ SOLN
5000.0000 [IU] | Freq: Three times a day (TID) | INTRAMUSCULAR | Status: DC
  Administered 2013-05-04 – 2013-05-07 (×8): 5000 [IU] via SUBCUTANEOUS
  Filled 2013-05-04 (×8): qty 1

## 2013-05-04 MED ORDER — HYDROMORPHONE HCL PF 1 MG/ML IJ SOLN: 0.5000 mg | INTRAMUSCULAR | Status: DC | PRN

## 2013-05-04 MED ORDER — ONDANSETRON HCL 4 MG/2ML IJ SOLN: 4.0000 mg | Freq: Four times a day (QID) | INTRAMUSCULAR | Status: DC | PRN

## 2013-05-04 MED ORDER — ACETAMINOPHEN 325 MG PO TABS: 650.0000 mg | ORAL_TABLET | Freq: Four times a day (QID) | ORAL | Status: DC | PRN

## 2013-05-04 MED ORDER — LINAGLIPTIN 5 MG PO TABS
5.0000 mg | ORAL_TABLET | Freq: Every day | ORAL | Status: DC
  Administered 2013-05-04 – 2013-05-07 (×4): 5 mg via ORAL
  Filled 2013-05-04 (×6): qty 1

## 2013-05-04 MED ORDER — SODIUM CHLORIDE 0.9 % IV SOLN
INTRAVENOUS | Status: DC
  Administered 2013-05-04 – 2013-05-07 (×6): via INTRAVENOUS

## 2013-05-04 MED ORDER — SILVER SULFADIAZINE 1 % EX CREA
TOPICAL_CREAM | Freq: Once | CUTANEOUS | Status: AC
  Administered 2013-05-04: 10:00:00 via TOPICAL

## 2013-05-04 MED ORDER — GLYBURIDE 5 MG PO TABS
5.0000 mg | ORAL_TABLET | Freq: Every day | ORAL | Status: DC
  Administered 2013-05-05 – 2013-05-07 (×3): 5 mg via ORAL
  Filled 2013-05-04 (×3): qty 1

## 2013-05-04 MED ORDER — SILVER SULFADIAZINE 1 % EX CREA
TOPICAL_CREAM | CUTANEOUS | Status: AC
  Filled 2013-05-04: qty 50

## 2013-05-04 MED ORDER — VANCOMYCIN HCL 10 G IV SOLR
2000.0000 mg | Freq: Once | INTRAVENOUS | Status: AC
  Administered 2013-05-04: 2000 mg via INTRAVENOUS
  Filled 2013-05-04: qty 2000

## 2013-05-04 MED ORDER — INSULIN GLARGINE 100 UNIT/ML ~~LOC~~ SOLN
20.0000 [IU] | Freq: Every day | SUBCUTANEOUS | Status: DC
  Administered 2013-05-04 – 2013-05-06 (×3): 20 [IU] via SUBCUTANEOUS
  Filled 2013-05-04 (×5): qty 0.2

## 2013-05-04 MED ORDER — ACETAMINOPHEN 650 MG RE SUPP: 650.0000 mg | Freq: Four times a day (QID) | RECTAL | Status: DC | PRN

## 2013-05-04 MED ORDER — INSULIN GLARGINE 100 UNIT/ML ~~LOC~~ SOLN
SUBCUTANEOUS | Status: AC
  Filled 2013-05-04: qty 10

## 2013-05-04 MED ORDER — INSULIN ASPART 100 UNIT/ML ~~LOC~~ SOLN
0.0000 [IU] | Freq: Three times a day (TID) | SUBCUTANEOUS | Status: DC
  Administered 2013-05-04 – 2013-05-05 (×3): 5 [IU] via SUBCUTANEOUS
  Administered 2013-05-05: 11 [IU] via SUBCUTANEOUS
  Administered 2013-05-05: 5 [IU] via SUBCUTANEOUS
  Administered 2013-05-06 (×2): 8 [IU] via SUBCUTANEOUS
  Administered 2013-05-06: 2 [IU] via SUBCUTANEOUS
  Administered 2013-05-07 (×2): 3 [IU] via SUBCUTANEOUS

## 2013-05-04 MED ORDER — HYDROCODONE-ACETAMINOPHEN 5-325 MG PO TABS: 1.0000 | ORAL_TABLET | ORAL | Status: DC | PRN

## 2013-05-04 NOTE — Progress Notes (Signed)
UR chart review completed.  
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
## 2013-05-04 NOTE — ED Notes (Signed)
Sterile dressings applied to bil feet. Pt tolerated well.

## 2013-05-04 NOTE — ED Notes (Signed)
Pt resting in bed, awaiting admission.
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
## 2013-05-05 LAB — BASIC METABOLIC PANEL
CO2: 23 mEq/L (ref 19–32)
Calcium: 8.7 mg/dL (ref 8.4–10.5)
Chloride: 100 mEq/L (ref 96–112)
GFR calc Af Amer: 21 mL/min — ABNORMAL LOW (ref >90)
Sodium: 132 mEq/L — ABNORMAL LOW (ref 135–145)

## 2013-05-05 LAB — GLUCOSE, CAPILLARY

## 2013-05-05 LAB — CBC
MCV: 72.3 fL — ABNORMAL LOW (ref 78.0–100.0)
Platelets: 444 10*3/uL — ABNORMAL HIGH (ref 150–400)
RBC: 3.29 MIL/uL — ABNORMAL LOW (ref 4.22–5.81)
RDW: 13 % (ref 11.5–15.5)
WBC: 10.7 10*3/uL — ABNORMAL HIGH (ref 4.0–10.5)

## 2013-05-05 MED ORDER — POTASSIUM CHLORIDE 10 MEQ/100ML IV SOLN
INTRAVENOUS | Status: AC
  Filled 2013-05-05: qty 100

## 2013-05-05 MED ORDER — VANCOMYCIN HCL IN DEXTROSE 1-5 GM/200ML-% IV SOLN
1000.0000 mg | INTRAVENOUS | Status: DC
  Administered 2013-05-05 – 2013-05-07 (×3): 1000 mg via INTRAVENOUS
  Filled 2013-05-05 (×5): qty 200

## 2013-05-05 MED ORDER — AMLODIPINE BESYLATE 5 MG PO TABS
10.0000 mg | ORAL_TABLET | Freq: Every day | ORAL | Status: DC
  Administered 2013-05-06 – 2013-05-07 (×2): 10 mg via ORAL
  Filled 2013-05-05 (×2): qty 2

## 2013-05-05 MED ORDER — LEVOFLOXACIN IN D5W 500 MG/100ML IV SOLN
500.0000 mg | INTRAVENOUS | Status: DC
  Administered 2013-05-06: 500 mg via INTRAVENOUS
  Filled 2013-05-05 (×2): qty 100

## 2013-05-05 NOTE — Progress Notes (Signed)
Pt. BP has been trending high, MD notified, Norvasc dose increased to 10mg . Will continue to monitor
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
## 2013-05-05 NOTE — Progress Notes (Signed)
Inpatient Diabetes Program Recommendations  AACE/ADA: New Consensus Statement on Inpatient Glycemic Control (2013)  Target Ranges:  Prepandial:   less than 140 mg/dL      Peak postprandial:   less than 180 mg/dL (1-2 hours)      Critically ill patients:  140 - 180 mg/dL   Results for DMONTE, MAHER (MRN [id number]) as of 05/05/2013 10:40  Ref. Range 05/04/2013 08:52 05/04/2013 13:24 05/04/2013 17:00 05/04/2013 20:57 05/05/2013 07:27  Glucose-Capillary Latest Range: 70-99 mg/dL 409 (H) 811 (H) 914 (H) 185 (H) 239 (H)    Inpatient Diabetes Program Recommendations Insulin - Basal: Pleae increase Lantus to 30 units QHS. Correction (SSI): Please add bedtime Novolog correction.  Note:  Patient has a history of diabetes and takes Lantus 30 units QHS, Tradjenta 5mg  daily, and Glyburide 5mg  QAM at home for diabetes management.  Currently, patient is ordered to receive Lantus 20 units QHS, Novolog 0-15 units AC, Tradjenta 5mg  daily, and Glyburide 5 mg QAM for inpatient glycemic control.  Blood glucose over the past 24 hours has ranged from 185-310 mg/dl.  Please increase Lantus to 30 units QHS and add bedtime Novolog correction to improve inpatient glycemic control.  Will continue to follow.  Thanks, Orlando Penner, RN, MSN, CCRN Diabetes Coordinator Inpatient Diabetes Program [phone]

## 2013-05-06 LAB — BASIC METABOLIC PANEL
BUN: 36 mg/dL — ABNORMAL HIGH (ref 6–23)
CO2: 23 mEq/L (ref 19–32)
Chloride: 103 mEq/L (ref 96–112)
GFR calc Af Amer: 29 mL/min — ABNORMAL LOW (ref >90)
Glucose, Bld: 150 mg/dL — ABNORMAL HIGH (ref 70–99)
Potassium: 4.6 mEq/L (ref 3.5–5.1)

## 2013-05-06 LAB — CBC
HCT: 23.1 % — ABNORMAL LOW (ref 39.0–52.0)
Hemoglobin: 7.8 g/dL — ABNORMAL LOW (ref 13.0–17.0)
MCH: 24.5 pg — ABNORMAL LOW (ref 26.0–34.0)
MCHC: 33.8 g/dL (ref 30.0–36.0)
MCV: 72.4 fL — ABNORMAL LOW (ref 78.0–100.0)

## 2013-05-06 LAB — GLUCOSE, CAPILLARY: Glucose-Capillary: 254 mg/dL — ABNORMAL HIGH (ref 70–99)

## 2013-05-06 NOTE — Progress Notes (Signed)
Inpatient Diabetes Program Recommendations  AACE/ADA: New Consensus Statement on Inpatient Glycemic Control (2013)  Target Ranges:  Prepandial:   less than 140 mg/dL      Peak postprandial:   less than 180 mg/dL (1-2 hours)      Critically ill patients:  140 - 180 mg/dL   Results for STEPAN, VERRETTE (MRN [id number]) as of 05/06/2013 08:22  Ref. Range 05/05/2013 07:27 05/05/2013 11:15 05/05/2013 16:28 05/05/2013 22:07 05/06/2013 07:17  Glucose-Capillary Latest Range: 70-99 mg/dL 782 (H) 956 (H) 213 (H) 245 (H) 130 (H)    Inpatient Diabetes Program Recommendations Insulin - Basal: Please consider increasing Lantus to 25 units QHS. Correction (SSI): Please add bedtime Novolog correction.  Note: Blood glucose over the past 24 hours has ranged from 130-340 mg/dl.  Patient was given Novolog 5 units at 22:24 last night and fasting blood glucose was 130 mg/dl this morning.  However, patient does not have Novolog bedtime correction ordered at this time.  Please increase Lantus to 25 units QHS and add Novolog bedtime correction to improve inpatient glycemic control.  Will continue to follow.  Thanks, Orlando Penner, RN, MSN, CCRN Diabetes Coordinator Inpatient Diabetes Program [phone]

## 2013-05-06 NOTE — Progress Notes (Signed)
Spoke with pt about diabetes.  Patient reports that he has had diabetes for about 5 years.  According to the patient, his home Lantus was just increased two weeks ago from 15 units QHS to 30 units QHS.  Discussed A1C results (10.6%) with him and explained the importance of checking CBGs and maintaining good CBG control to prevent long-term and short-term complications.  Patient reports that he follows up with Dr. Felecia Shelling frequently and that Dr. Felecia Shelling checks his A1C but he does not remember what it usually runs.  He reports that he thinks the current A1C of 10.6% is an improvement for him.  Inquired about medication compliance and patient reports that he takes his medications as directed by Dr. Felecia Shelling and that he checks his blood glucose which usually runs in 180's.  Stressed need for diabetes control to prevent potential complications.  Asked patient if he had an endocrinologist and he reported that he has not ever seen an endocrinologist.  Provided him with information on Dr. Fransico Him and encouraged him to set up an appointment with Dr. Fransico Him for diabetes management.  Also provided patient with Prevent Diabetes Problems:  Keep Your Diabetes Under Control book. Patient verbalized understanding of information discussed and states that he has no further questions at this time.  Thanks, Orlando Penner, RN, MSN, CCRN Diabetes Coordinator Inpatient Diabetes Program [phone]
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
## 2013-05-07 LAB — BASIC METABOLIC PANEL
BUN: 31 mg/dL — ABNORMAL HIGH (ref 6–23)
Chloride: 99 mEq/L (ref 96–112)
Creatinine, Ser: 2.86 mg/dL — ABNORMAL HIGH (ref 0.50–1.35)
GFR calc Af Amer: 31 mL/min — ABNORMAL LOW (ref >90)

## 2013-05-07 LAB — GLUCOSE, CAPILLARY: Glucose-Capillary: 234 mg/dL — ABNORMAL HIGH (ref 70–99)

## 2013-05-07 MED ORDER — DOXYCYCLINE HYCLATE 50 MG PO CAPS: 50.0000 mg | ORAL_CAPSULE | Freq: Two times a day (BID) | ORAL | Status: DC

## 2013-05-07 NOTE — Progress Notes (Signed)
Discharge instructions given, verbalized understanding, dressings changed to bilateral feet, instructed patient's friend on how to change the dressings and how often, dressing supplies sent home with patient, friend verbalized understanding of dressing changes, also notified patient that home health would be set up by case manager on Monday, 05/09/14 if home health is needed, patient verbalized understanding, out in stable condition with staff via w/c.
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
## 2013-05-09 LAB — CULTURE, BLOOD (ROUTINE X 2)
Culture: NO GROWTH
Culture: NO GROWTH

## 2013-05-09 NOTE — Progress Notes (Signed)
UR chart review completed.  
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
## 2013-05-12 ENCOUNTER — Ambulatory Visit (HOSPITAL_COMMUNITY): Payer: Medicaid Other | Admitting: Oncology

## 2013-05-26 ENCOUNTER — Encounter (HOSPITAL_COMMUNITY): Payer: Self-pay | Admitting: Oncology

## 2013-05-26 ENCOUNTER — Encounter (HOSPITAL_COMMUNITY): Payer: Medicaid Other | Attending: Hematology and Oncology | Admitting: Oncology

## 2013-05-26 VITALS — BP 160/84 | HR 101 | Temp 98.3°F | Resp 20 | Wt 297.6 lb

## 2013-05-26 DIAGNOSIS — D509 Iron deficiency anemia, unspecified: Secondary | ICD-10-CM | POA: Insufficient documentation

## 2013-05-26 DIAGNOSIS — D890 Polyclonal hypergammaglobulinemia: Secondary | ICD-10-CM

## 2013-05-26 LAB — CBC WITH DIFFERENTIAL/PLATELET
Basophils Absolute: 0 10*3/uL (ref 0.0–0.1)
Lymphocytes Relative: 13 % (ref 12–46)
Monocytes Relative: 7 % (ref 3–12)
Neutrophils Relative %: 77 % (ref 43–77)
Platelets: 376 10*3/uL (ref 150–400)
RDW: 13.8 % (ref 11.5–15.5)
WBC: 9 10*3/uL (ref 4.0–10.5)

## 2013-05-26 NOTE — Patient Instructions (Addendum)
[facility] Discharge Instructions  RECOMMENDATIONS MADE BY THE CONSULTANT AND ANY TEST RESULTS WILL BE SENT TO YOUR REFERRING PHYSICIAN.  Lab work today. We will call you with any abnormal results. Return to clinic in 3 weeks to see PA.  Thank you for choosing [facility] to provide your oncology and hematology care.  To afford each patient quality time with our providers, please arrive at least 15 minutes before your scheduled appointment time.  With your help, our goal is to use those 15 minutes to complete the necessary work-up to ensure our physicians have the information they need to help with your evaluation and healthcare recommendations.    Effective January 1st, 2014, we ask that you re-schedule your appointment with our physicians should you arrive 10 or more minutes late for your appointment.  We strive to give you quality time with our providers, and arriving late affects you and other patients whose appointments are after yours.    Again, thank you for choosing [facility].  Our hope is that these requests will decrease the amount of time that you wait before being seen by our physicians.       _____________________________________________________________  Should you have questions after your visit to [facility], please contact our office at [phone] between the hours of 8:30 a.m. and 5:00 p.m.  Voicemails left after 4:30 p.m. will not be returned until the following business day.  For prescription refill requests, have your pharmacy contact our office with your prescription refill request.

## 2013-05-26 NOTE — Progress Notes (Signed)
Donald Sampson presented for labwork. Labs per MD order drawn via Peripheral Line 23 gauge needle inserted in left antecubital.  Good blood return present. Procedure without incident.  Needle removed intact. Patient tolerated procedure well.
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
## 2013-05-27 LAB — FOLATE: Folate: 10.9 ng/mL

## 2013-05-27 LAB — VITAMIN B12: Vitamin B-12: 432 pg/mL (ref 211–911)

## 2013-06-16 ENCOUNTER — Ambulatory Visit (HOSPITAL_COMMUNITY): Payer: Medicaid Other | Admitting: Oncology

## 2013-06-17 ENCOUNTER — Ambulatory Visit (HOSPITAL_COMMUNITY): Payer: Medicaid Other | Admitting: Oncology

## 2013-06-20 ENCOUNTER — Ambulatory Visit (HOSPITAL_COMMUNITY): Admission: RE | Admit: 2013-06-20 | Payer: Medicaid Other | Source: Ambulatory Visit

## 2013-06-24 ENCOUNTER — Ambulatory Visit (HOSPITAL_COMMUNITY): Payer: Medicaid Other | Admitting: Oncology

## 2013-06-24 NOTE — Progress Notes (Signed)
-  No show, letter sent-   

## 2013-07-28 ENCOUNTER — Encounter (HOSPITAL_COMMUNITY): Payer: Self-pay | Admitting: Oncology

## 2013-08-31 ENCOUNTER — Encounter (HOSPITAL_COMMUNITY): Payer: Self-pay | Admitting: Oncology

## 2013-09-08 ENCOUNTER — Emergency Department (HOSPITAL_COMMUNITY): Payer: Medicaid Other

## 2013-09-08 ENCOUNTER — Encounter (HOSPITAL_COMMUNITY): Payer: Self-pay | Admitting: Emergency Medicine

## 2013-09-08 ENCOUNTER — Inpatient Hospital Stay (HOSPITAL_COMMUNITY)
Admission: EM | Admit: 2013-09-08 | Discharge: 2013-09-12 | DRG: 638 | Disposition: A | Discharge status: Home Health | Payer: Medicaid Other | Attending: Internal Medicine | Admitting: Internal Medicine

## 2013-09-08 DIAGNOSIS — L02419 Cutaneous abscess of limb, unspecified: Secondary | ICD-10-CM | POA: Diagnosis present

## 2013-09-08 DIAGNOSIS — D509 Iron deficiency anemia, unspecified: Secondary | ICD-10-CM | POA: Diagnosis present

## 2013-09-08 DIAGNOSIS — E669 Obesity, unspecified: Secondary | ICD-10-CM | POA: Diagnosis present

## 2013-09-08 DIAGNOSIS — Z794 Long term (current) use of insulin: Secondary | ICD-10-CM

## 2013-09-08 DIAGNOSIS — E1310 Other specified diabetes mellitus with ketoacidosis without coma: Principal | ICD-10-CM | POA: Diagnosis present

## 2013-09-08 DIAGNOSIS — E1149 Type 2 diabetes mellitus with other diabetic neurological complication: Secondary | ICD-10-CM | POA: Diagnosis present

## 2013-09-08 DIAGNOSIS — I129 Hypertensive chronic kidney disease with stage 1 through stage 4 chronic kidney disease, or unspecified chronic kidney disease: Secondary | ICD-10-CM | POA: Diagnosis present

## 2013-09-08 DIAGNOSIS — N179 Acute kidney failure, unspecified: Secondary | ICD-10-CM | POA: Diagnosis present

## 2013-09-08 DIAGNOSIS — B351 Tinea unguium: Secondary | ICD-10-CM | POA: Diagnosis present

## 2013-09-08 DIAGNOSIS — E1142 Type 2 diabetes mellitus with diabetic polyneuropathy: Secondary | ICD-10-CM | POA: Diagnosis present

## 2013-09-08 DIAGNOSIS — Z9119 Patient's noncompliance with other medical treatment and regimen: Secondary | ICD-10-CM

## 2013-09-08 DIAGNOSIS — Z91199 Patient's noncompliance with other medical treatment and regimen due to unspecified reason: Secondary | ICD-10-CM

## 2013-09-08 DIAGNOSIS — M9080 Osteopathy in diseases classified elsewhere, unspecified site: Secondary | ICD-10-CM | POA: Diagnosis present

## 2013-09-08 DIAGNOSIS — E11319 Type 2 diabetes mellitus with unspecified diabetic retinopathy without macular edema: Secondary | ICD-10-CM | POA: Diagnosis present

## 2013-09-08 DIAGNOSIS — D638 Anemia in other chronic diseases classified elsewhere: Secondary | ICD-10-CM | POA: Diagnosis present

## 2013-09-08 DIAGNOSIS — L97509 Non-pressure chronic ulcer of other part of unspecified foot with unspecified severity: Secondary | ICD-10-CM | POA: Diagnosis present

## 2013-09-08 DIAGNOSIS — N2581 Secondary hyperparathyroidism of renal origin: Secondary | ICD-10-CM | POA: Diagnosis present

## 2013-09-08 DIAGNOSIS — L02619 Cutaneous abscess of unspecified foot: Secondary | ICD-10-CM | POA: Diagnosis present

## 2013-09-08 DIAGNOSIS — H409 Unspecified glaucoma: Secondary | ICD-10-CM | POA: Diagnosis present

## 2013-09-08 DIAGNOSIS — E876 Hypokalemia: Secondary | ICD-10-CM | POA: Diagnosis present

## 2013-09-08 DIAGNOSIS — R739 Hyperglycemia, unspecified: Secondary | ICD-10-CM

## 2013-09-08 DIAGNOSIS — N184 Chronic kidney disease, stage 4 (severe): Secondary | ICD-10-CM | POA: Diagnosis present

## 2013-09-08 DIAGNOSIS — E871 Hypo-osmolality and hyponatremia: Secondary | ICD-10-CM | POA: Diagnosis present

## 2013-09-08 DIAGNOSIS — E1139 Type 2 diabetes mellitus with other diabetic ophthalmic complication: Secondary | ICD-10-CM | POA: Diagnosis present

## 2013-09-08 DIAGNOSIS — IMO0002 Reserved for concepts with insufficient information to code with codable children: Secondary | ICD-10-CM | POA: Diagnosis present

## 2013-09-08 DIAGNOSIS — M869 Osteomyelitis, unspecified: Secondary | ICD-10-CM | POA: Diagnosis present

## 2013-09-08 DIAGNOSIS — E1165 Type 2 diabetes mellitus with hyperglycemia: Secondary | ICD-10-CM | POA: Diagnosis present

## 2013-09-08 DIAGNOSIS — E1129 Type 2 diabetes mellitus with other diabetic kidney complication: Secondary | ICD-10-CM | POA: Diagnosis present

## 2013-09-08 DIAGNOSIS — H548 Legal blindness, as defined in USA: Secondary | ICD-10-CM | POA: Diagnosis present

## 2013-09-08 DIAGNOSIS — N19 Unspecified kidney failure: Secondary | ICD-10-CM

## 2013-09-08 LAB — URINALYSIS, ROUTINE W REFLEX MICROSCOPIC
Ketones, ur: NEGATIVE mg/dL
Leukocytes, UA: NEGATIVE
Nitrite: NEGATIVE
Urobilinogen, UA: 0.2 mg/dL (ref 0.0–1.0)
pH: 5.5 (ref 5.0–8.0)

## 2013-09-08 LAB — URINE MICROSCOPIC-ADD ON

## 2013-09-08 LAB — GLUCOSE, CAPILLARY
Glucose-Capillary: 218 mg/dL — ABNORMAL HIGH (ref 70–99)
Glucose-Capillary: 262 mg/dL — ABNORMAL HIGH (ref 70–99)
Glucose-Capillary: 353 mg/dL — ABNORMAL HIGH (ref 70–99)
Glucose-Capillary: 403 mg/dL — ABNORMAL HIGH (ref 70–99)
Glucose-Capillary: 429 mg/dL — ABNORMAL HIGH (ref 70–99)
Glucose-Capillary: 531 mg/dL — ABNORMAL HIGH (ref 70–99)

## 2013-09-08 LAB — COMPREHENSIVE METABOLIC PANEL WITH GFR
ALT: 43 U/L (ref 0–53)
AST: 43 U/L — ABNORMAL HIGH (ref 0–37)
Albumin: 1.6 g/dL — ABNORMAL LOW (ref 3.5–5.2)
Alkaline Phosphatase: 363 U/L — ABNORMAL HIGH (ref 39–117)
BUN: 78 mg/dL — ABNORMAL HIGH (ref 6–23)
CO2: 19 meq/L (ref 19–32)
Calcium: 7.9 mg/dL — ABNORMAL LOW (ref 8.4–10.5)
Chloride: 81 meq/L — ABNORMAL LOW (ref 96–112)
Creatinine, Ser: 7.47 mg/dL — ABNORMAL HIGH (ref 0.50–1.35)
GFR calc Af Amer: 10 mL/min — ABNORMAL LOW
GFR calc non Af Amer: 8 mL/min — ABNORMAL LOW
Glucose, Bld: 572 mg/dL (ref 70–99)
Potassium: 4.3 meq/L (ref 3.5–5.1)
Sodium: 117 meq/L — CL (ref 135–145)
Total Bilirubin: 1.2 mg/dL (ref 0.3–1.2)
Total Protein: 6.8 g/dL (ref 6.0–8.3)

## 2013-09-08 LAB — CBC WITH DIFFERENTIAL/PLATELET
Basophils Absolute: 0 10*3/uL (ref 0.0–0.1)
Basophils Relative: 0 % (ref 0–1)
Eosinophils Absolute: 0 10*3/uL (ref 0.0–0.7)
Eosinophils Relative: 0 % (ref 0–5)
HCT: 16.7 % — ABNORMAL LOW (ref 39.0–52.0)
Hemoglobin: 5.4 g/dL — CL (ref 13.0–17.0)
Lymphocytes Relative: 2 % — ABNORMAL LOW (ref 12–46)
Lymphs Abs: 0.5 10*3/uL — ABNORMAL LOW (ref 0.7–4.0)
MCH: 22.2 pg — ABNORMAL LOW (ref 26.0–34.0)
MCHC: 32.3 g/dL (ref 30.0–36.0)
MCV: 68.7 fL — ABNORMAL LOW (ref 78.0–100.0)
Monocytes Absolute: 2.3 10*3/uL — ABNORMAL HIGH (ref 0.1–1.0)
Monocytes Relative: 10 % (ref 3–12)
Neutro Abs: 19.9 10*3/uL — ABNORMAL HIGH (ref 1.7–7.7)
Neutrophils Relative %: 88 % — ABNORMAL HIGH (ref 43–77)
Platelets: 415 10*3/uL — ABNORMAL HIGH (ref 150–400)
RBC: 2.43 MIL/uL — ABNORMAL LOW (ref 4.22–5.81)
RDW: 15 % (ref 11.5–15.5)
WBC Morphology: INCREASED
WBC: 22.7 10*3/uL — ABNORMAL HIGH (ref 4.0–10.5)

## 2013-09-08 LAB — BASIC METABOLIC PANEL
BUN: 77 mg/dL — ABNORMAL HIGH (ref 6–23)
Chloride: 80 mEq/L — ABNORMAL LOW (ref 96–112)
GFR calc Af Amer: 10 mL/min — ABNORMAL LOW (ref >90)
Glucose, Bld: 572 mg/dL (ref 70–99)
Potassium: 4.1 mEq/L (ref 3.5–5.1)
Sodium: 116 mEq/L — CL (ref 135–145)

## 2013-09-08 LAB — RETICULOCYTES
RBC.: 2.44 MIL/uL — ABNORMAL LOW (ref 4.22–5.81)
Retic Count, Absolute: 34.2 10*3/uL (ref 19.0–186.0)
Retic Ct Pct: 1.4 % (ref 0.4–3.1)

## 2013-09-08 LAB — KETONES, QUALITATIVE: Acetone, Bld: NEGATIVE

## 2013-09-08 LAB — ABO/RH: ABO/RH(D): A POS

## 2013-09-08 LAB — PREPARE RBC (CROSSMATCH)

## 2013-09-08 MED ORDER — VANCOMYCIN HCL IN DEXTROSE 1-5 GM/200ML-% IV SOLN
1000.0000 mg | INTRAVENOUS | Status: AC
  Filled 2013-09-08 (×2): qty 200

## 2013-09-08 MED ORDER — SODIUM CHLORIDE 0.9 % IV SOLN
Freq: Once | INTRAVENOUS | Status: AC
  Administered 2013-09-08: 15:00:00 via INTRAVENOUS

## 2013-09-08 MED ORDER — VANCOMYCIN HCL IN DEXTROSE 1-5 GM/200ML-% IV SOLN
1000.0000 mg | INTRAVENOUS | Status: AC
  Administered 2013-09-09 (×2): 1000 mg via INTRAVENOUS
  Filled 2013-09-08 (×2): qty 200

## 2013-09-08 MED ORDER — SODIUM CHLORIDE 0.9 % IV SOLN: INTRAVENOUS | Status: AC

## 2013-09-08 MED ORDER — VANCOMYCIN HCL IN DEXTROSE 1-5 GM/200ML-% IV SOLN
INTRAVENOUS | Status: AC
  Filled 2013-09-08: qty 200

## 2013-09-08 MED ORDER — SODIUM CHLORIDE 0.9 % IV SOLN
INTRAVENOUS | Status: DC
  Administered 2013-09-08: 10.1 [IU]/h via INTRAVENOUS
  Administered 2013-09-08: 4.7 [IU]/h via INTRAVENOUS
  Administered 2013-09-09: 18 [IU]/h via INTRAVENOUS
  Administered 2013-09-09: 8.1 [IU]/h via INTRAVENOUS
  Administered 2013-09-09: 4.3 [IU]/h via INTRAVENOUS
  Administered 2013-09-09: 5.5 [IU]/h via INTRAVENOUS
  Administered 2013-09-09: 21:00:00 via INTRAVENOUS
  Administered 2013-09-09: 3 [IU]/h via INTRAVENOUS
  Filled 2013-09-08 (×4): qty 1

## 2013-09-08 MED ORDER — BRIMONIDINE TARTRATE 0.2 % OP SOLN
OPHTHALMIC | Status: AC
  Filled 2013-09-08: qty 5

## 2013-09-08 MED ORDER — HEPARIN SODIUM (PORCINE) 5000 UNIT/ML IJ SOLN
5000.0000 [IU] | Freq: Three times a day (TID) | INTRAMUSCULAR | Status: DC
  Administered 2013-09-08 – 2013-09-12 (×11): 5000 [IU] via SUBCUTANEOUS
  Filled 2013-09-08 (×11): qty 1

## 2013-09-08 MED ORDER — DEXTROSE-NACL 5-0.45 % IV SOLN
INTRAVENOUS | Status: DC
  Administered 2013-09-09: via INTRAVENOUS

## 2013-09-08 MED ORDER — INSULIN ASPART 100 UNIT/ML ~~LOC~~ SOLN
12.0000 [IU] | Freq: Once | SUBCUTANEOUS | Status: AC
  Administered 2013-09-08: 12 [IU] via SUBCUTANEOUS
  Filled 2013-09-08: qty 1

## 2013-09-08 MED ORDER — VANCOMYCIN HCL 10 G IV SOLR
1500.0000 mg | INTRAVENOUS | Status: DC
  Administered 2013-09-10: 1500 mg via INTRAVENOUS
  Filled 2013-09-08 (×2): qty 1500

## 2013-09-08 MED ORDER — PIPERACILLIN-TAZOBACTAM 3.375 G IVPB
INTRAVENOUS | Status: AC
  Filled 2013-09-08: qty 50

## 2013-09-08 MED ORDER — PIPERACILLIN-TAZOBACTAM IN DEX 2-0.25 GM/50ML IV SOLN
2.2500 g | Freq: Four times a day (QID) | INTRAVENOUS | Status: DC
  Filled 2013-09-08 (×3): qty 50

## 2013-09-08 MED ORDER — AMLODIPINE BESYLATE 5 MG PO TABS
5.0000 mg | ORAL_TABLET | Freq: Every day | ORAL | Status: DC
  Administered 2013-09-09 – 2013-09-12 (×4): 5 mg via ORAL
  Filled 2013-09-08 (×4): qty 1

## 2013-09-08 MED ORDER — SODIUM CHLORIDE 0.9 % IV BOLUS (SEPSIS)
1000.0000 mL | Freq: Once | INTRAVENOUS | Status: AC
  Administered 2013-09-08: 1000 mL via INTRAVENOUS

## 2013-09-08 MED ORDER — BRIMONIDINE TARTRATE 0.2 % OP SOLN
1.0000 [drp] | Freq: Three times a day (TID) | OPHTHALMIC | Status: DC
  Administered 2013-09-08 – 2013-09-12 (×11): 1 [drp] via OPHTHALMIC
  Filled 2013-09-08: qty 5

## 2013-09-08 MED ORDER — SODIUM CHLORIDE 0.9 % IV SOLN
INTRAVENOUS | Status: DC
  Administered 2013-09-08: 21:00:00 via INTRAVENOUS

## 2013-09-08 MED ORDER — PIPERACILLIN-TAZOBACTAM IN DEX 2-0.25 GM/50ML IV SOLN
2.2500 g | Freq: Four times a day (QID) | INTRAVENOUS | Status: DC
  Administered 2013-09-09 – 2013-09-12 (×13): 2.25 g via INTRAVENOUS
  Filled 2013-09-08 (×18): qty 50

## 2013-09-08 MED ORDER — PIPERACILLIN-TAZOBACTAM 3.375 G IVPB
3.3750 g | Freq: Once | INTRAVENOUS | Status: DC
  Filled 2013-09-08: qty 50

## 2013-09-08 NOTE — Consult Note (Signed)
Reason for Consult: Worsening of renal failure Referring Physician: Dr. Halford Chessman Donald Sampson is an 37 y.o. male.  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 According to the patient the last couple of days his blood sugar has been running very high and he couldn't control it.  Presently patient denies any nausea no vomiting.she has history of polydipsia and polyuria. Patient denies any difficulty in breathing. Consult is called because of worsening of his renal failure.   Past Medical History  Diagnosis Date  . Diabetes mellitus   . Hypertension   . Glaucoma (increased eye pressure) 2011  . Microcytic anemia 04/25/2013  . 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 Refractive surgery      No family history on file.  Social History:  reports that he has never smoked. He has never used smokeless tobacco. He reports that he does not drink alcohol or use illicit drugs.  Allergies: No Known Allergies  Medications: I have reviewed the patient's current medications.  Results for orders placed during the hospital encounter of 09/08/13 (from the past 48 hour(s))  GLUCOSE, CAPILLARY     Status: Abnormal   Collection Time    09/08/13 12:27 PM      Result Value Range   Glucose-Capillary 527 (*) 70 - 99 mg/dL   Comment 1 Documented in Chart    BASIC METABOLIC PANEL     Status: Abnormal   Collection Time    09/08/13 12:58 PM      Result Value Range   Sodium 116 (*) 135 - 145 mEq/L   Comment: CRITICAL RESULT CALLED TO, READ BACK BY AND VERIFIED WITH:     EDWARDS,C AT 1338 BY HUFFINES,S ON 09/08/13   Potassium 4.1  3.5 - 5.1 mEq/L   Chloride 80 (*) 96 - 112 mEq/L   CO2 18 (*) 19 - 32 mEq/L   Glucose, Bld 572 (*) 70 - 99 mg/dL   Comment: CRITICAL RESULT CALLED TO, READ BACK BY AND VERIFIED  WITH:     EDWARDS,C AT 1338 BY HUFFINES,S ON 09/08/13   BUN 77 (*) 6 - 23 mg/dL   Creatinine, Ser 2.13 (*) 0.50 - 1.35 mg/dL   Calcium 8.1 (*) 8.4 - 10.5 mg/dL   GFR calc non Af Amer 9 (*) >90 mL/min   GFR calc Af Amer 10 (*) >90 mL/min   Comment: (NOTE)     The eGFR has been calculated using the CKD EPI equation.     This calculation has not been validated in all clinical situations.     eGFR's persistently <90 mL/min signify possible Chronic Kidney     Disease.  CBC WITH DIFFERENTIAL     Status: Abnormal   Collection Time    09/08/13 12:58 PM      Result Value Range   WBC 22.7 (*) 4.0 - 10.5 K/uL   RBC 2.43 (*) 4.22 - 5.81 MIL/uL   Hemoglobin 5.4 (*) 13.0 - 17.0 g/dL   Comment: CRITICAL RESULT CALLED TO, READ BACK BY AND VERIFIED WITH:     EDWARDS,C. AT 1437 ON 09/08/2013 BY BAUGHAM,M.   HCT 16.7 (*) 39.0 - 52.0 %   Comment: RESULT REPEATED AND VERIFIED   MCV 68.7 (*) 78.0 - 100.0 fL   MCH 22.2 (*) 26.0 - 34.0 pg  MCHC 32.3  30.0 - 36.0 g/dL   RDW 40.9  81.1 - 91.4 %   Platelets 415 (*) 150 - 400 K/uL   Neutrophils Relative % 88 (*) 43 - 77 %   Lymphocytes Relative 2 (*) 12 - 46 %   Monocytes Relative 10  3 - 12 %   Eosinophils Relative 0  0 - 5 %   Basophils Relative 0  0 - 1 %   Neutro Abs 19.9 (*) 1.7 - 7.7 K/uL   Lymphs Abs 0.5 (*) 0.7 - 4.0 K/uL   Monocytes Absolute 2.3 (*) 0.1 - 1.0 K/uL   Eosinophils Absolute 0.0  0.0 - 0.7 K/uL   Basophils Absolute 0.0  0.0 - 0.1 K/uL   WBC Morphology INCREASED BANDS (>20% BANDS)     Comment: MILD LEFT SHIFT (1-5% METAS, OCC MYELO, OCC BANDS)     VACUOLATED NEUTROPHILS     DOHLE BODIES   Smear Review PLATELET COUNT CONFIRMED BY SMEAR     Comment: PLATELETS APPEAR INCREASED     LARGE PLATELETS PRESENT     GIANT PLATELETS SEEN  URINALYSIS, ROUTINE W REFLEX MICROSCOPIC     Status: Abnormal   Collection Time    09/08/13  2:10 PM      Result Value Range   Color, Urine YELLOW  YELLOW   APPearance CLEAR  CLEAR   Specific  Gravity, Urine 1.025  1.005 - 1.030   pH 5.5  5.0 - 8.0   Glucose, UA 500 (*) NEGATIVE mg/dL   Hgb urine dipstick LARGE (*) NEGATIVE   Bilirubin Urine NEGATIVE  NEGATIVE   Ketones, ur NEGATIVE  NEGATIVE mg/dL   Protein, ur 782 (*) NEGATIVE mg/dL   Urobilinogen, UA 0.2  0.0 - 1.0 mg/dL   Nitrite NEGATIVE  NEGATIVE   Leukocytes, UA NEGATIVE  NEGATIVE  COMPREHENSIVE METABOLIC PANEL     Status: Abnormal   Collection Time    09/08/13  2:10 PM      Result Value Range   Sodium 117 (*) 135 - 145 mEq/L   Comment: CRITICAL RESULT CALLED TO, READ BACK BY AND VERIFIED WITH:     EDWARDS,C ON 09/08/13 AT 1535 BY LOY,C   Potassium 4.3  3.5 - 5.1 mEq/L   Chloride 81 (*) 96 - 112 mEq/L   CO2 19  19 - 32 mEq/L   Glucose, Bld 572 (*) 70 - 99 mg/dL   Comment: CRITICAL RESULT CALLED TO, READ BACK BY AND VERIFIED WITH:     EDWARDS,C ON 09/08/13 AT 1535 BY LOY,C   BUN 78 (*) 6 - 23 mg/dL   Creatinine, Ser 9.56 (*) 0.50 - 1.35 mg/dL   Calcium 7.9 (*) 8.4 - 10.5 mg/dL   Total Protein 6.8  6.0 - 8.3 g/dL   Albumin 1.6 (*) 3.5 - 5.2 g/dL   AST 43 (*) 0 - 37 U/L   ALT 43  0 - 53 U/L   Alkaline Phosphatase 363 (*) 39 - 117 U/L   Total Bilirubin 1.2  0.3 - 1.2 mg/dL   GFR calc non Af Amer 8 (*) >90 mL/min   GFR calc Af Amer 10 (*) >90 mL/min   Comment: (NOTE)     The eGFR has been calculated using the CKD EPI equation.     This calculation has not been validated in all clinical situations.     eGFR's persistently <90 mL/min signify possible Chronic Kidney     Disease.  URINE MICROSCOPIC-ADD ON  Status: Abnormal   Collection Time    09/08/13  2:10 PM      Result Value Range   Squamous Epithelial / LPF FEW (*) RARE   WBC, UA 0-2  <3 WBC/hpf   RBC / HPF 7-10  <3 RBC/hpf   Bacteria, UA FEW (*) RARE   Urine-Other AMORPHOUS URATES/PHOSPHATES    KETONES, QUALITATIVE     Status: None   Collection Time    09/08/13  2:40 PM      Result Value Range   Acetone, Bld NEGATIVE  NEGATIVE  GLUCOSE, CAPILLARY      Status: Abnormal   Collection Time    09/08/13  2:56 PM      Result Value Range   Glucose-Capillary 539 (*) 70 - 99 mg/dL   Comment 1 Notify RN    RETICULOCYTES     Status: Abnormal   Collection Time    09/08/13  3:41 PM      Result Value Range   Retic Ct Pct 1.4  0.4 - 3.1 %   RBC. 2.44 (*) 4.22 - 5.81 MIL/uL   Retic Count, Manual 34.2  19.0 - 186.0 K/uL  TYPE AND SCREEN     Status: None   Collection Time    09/08/13  3:45 PM      Result Value Range   ABO/RH(D) A POS     Antibody Screen NEG     Sample Expiration 09/11/2013    ABO/RH     Status: None   Collection Time    09/08/13  3:45 PM      Result Value Range   ABO/RH(D) A POS    GLUCOSE, CAPILLARY     Status: Abnormal   Collection Time    09/08/13  3:55 PM      Result Value Range   Glucose-Capillary 531 (*) 70 - 99 mg/dL   Comment 1 Notify RN    PREPARE RBC (CROSSMATCH)     Status: None   Collection Time    09/08/13  4:30 PM      Result Value Range   Order Confirmation ORDER PROCESSED BY BLOOD BANK    GLUCOSE, CAPILLARY     Status: Abnormal   Collection Time    09/08/13  4:49 PM      Result Value Range   Glucose-Capillary 459 (*) 70 - 99 mg/dL    Dg Foot Complete Left  09/08/2013   CLINICAL DATA:  Diabetes.  Swelling.  Possible osteomyelitis.  EXAM: LEFT FOOT - COMPLETE 3+ VIEW  COMPARISON:  MRI foot 06/17/2013.  FINDINGS: Extensive soft tissue swelling is noted about the left foot particularly over the left 5th digit and lateral aspect of the foot. Subcutaneous air is noted adjacent to the left 5th metatarsal. Destructive changes are noted of the proximal and distal portions of the 5th metatarsal and proximal left 5th phalanx. These findings are consistent with persistent or recurrent osteomyelitis of the left 5th digit with adjacent cellulitis and possible soft tissue abscess. The osteomyelitis most likely has a chronic component as callus formation is present.  IMPRESSION: Extensive changes suggesting  osteomyelitis of the left 5th metatarsal and proximal left 5th phalanx. Severe adjacent soft tissue swelling and soft tissue air is noted suggesting adjacent cellulitis and possible soft tissue abscess. These results will be called to the ordering clinician or representative by the Radiologist Assistant, and communication documented in the PACS Dashboard.   Electronically Signed   By: Maisie Fus  Register   On: 09/08/2013  14:45   Dg Foot Complete Right  09/08/2013   CLINICAL DATA:  Right foot pain and swelling.  History of diabetes.  EXAM: RIGHT FOOT COMPLETE - 3+ VIEW  COMPARISON:  None  FINDINGS: There is no evidence of acute fracture, subluxation or dislocation.  No radiographic evidence of osteomyelitis is noted.  The joint spaces are within normal limits.  No focal bony abnormalities are present.  The Lisfranc joints are unremarkable.  Dorsal soft tissue swelling is present.  IMPRESSION: Soft tissue swelling without bony abnormality.   Electronically Signed   By: Laveda Abbe M.D.   On: 09/08/2013 14:40    Review of Systems  Constitutional: Negative for fever and chills.  Eyes: Positive for blurred vision.  Respiratory: Negative for shortness of breath.   Cardiovascular: Negative for orthopnea and leg swelling.  Gastrointestinal: Positive for nausea. Negative for vomiting, abdominal pain and diarrhea.  Genitourinary: Negative for dysuria, urgency and frequency.  Endo/Heme/Allergies: Positive for polydipsia.   Blood pressure 131/63, pulse 94, temperature 98.2 F (36.8 C), temperature source Oral, resp. rate 22, height 6\' 1"  (1.854 m), weight 131.543 kg (290 lb), SpO2 98.00%. Physical Exam  Constitutional: He is oriented to person, place, and time.  HENT:  Mouth/Throat: No oropharyngeal exudate.  Eyes: No scleral icterus.  Neck: No JVD present.  Cardiovascular: Normal rate and regular rhythm.   Respiratory: No respiratory distress. He has no wheezes.  GI: There is no tenderness. There is no  rebound and no guarding.  Musculoskeletal: He exhibits no edema.  Neurological: He is alert and oriented to person, place, and time.  Skin: Skin is dry.    Assessment/Plan: Problem #1 acute kidney injury . This could be secondary to prerenal syndrome associated with diuretic use/hyperglycemia. Since patient is also on different antibiotics including ciprofloxacin interstitial nephritis also need to be entertained as a possibility. Other causes for his worsening of renal failure can include ACE inhibitor/ATN/natural progression of the disease. Presently patient doesn't have uremic sinus symptoms. Problem #2 chronic renal failure. Patient was seen in January of this year and during that time his baseline crit is about 2.5 2.7 area. Had an ultrasound showed right kidney 13.1 the kidneys 13.2 and workup for secondary cause of renal failure and proteinuria was negative except polyclonal gammopathy. The etiology could be diabetes/hypertension. Problem #3 hyponatremia sodium 117. His blood glucose however is about 500. Hence this could be a combination of pseudohyponatremia and hypervolemic hyponatremia. Presently patient is a symptomatic. 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 Patient had 5  grams of protein during his previous studies. Problem #7 cellulitis of his leg Problem #8 diabetic retinopathy. Problem #9 anemia most likely a combination of iron deficiency and anemia of chronic disease. Plan: We'll hydrate patient using normal saline at 150 cc per hour           We'll check his basic metabolic panel, phosphorus, intact PTH and 25 vitamin D           We'll check again his iron studies.           We'll continue to follow patient.  Dazani Norby S 09/08/2013, 5:58 PM

## 2013-09-08 NOTE — Plan of Care (Signed)
Problem: Consults Goal: Diagnosis-Diabetes Mellitus Outcome: Completed/Met Date Met:  09/08/13 Hyperglycemia
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
## 2013-09-08 NOTE — ED Notes (Signed)
CRITICAL VALUE ALERT  Critical value received:  Sodium 116, glucose 572  Date of notification:  09/08/13  Time of notification:  1337  Critical value read back:yes  Nurse who received alert:  c Shamon Cothran  MD notified (1st page):    Time of first page:    MD notified (2nd page):  Time of second page:  Responding MD:  Dr Fayrene Fearing  Time MD responded:  [phone]

## 2013-09-08 NOTE — Progress Notes (Signed)
ANTIBIOTIC CONSULT NOTE - INITIAL  Pharmacy Consult for Vancomycin Indication: osteomyelitis  No Known Allergies  Patient Measurements: Height: 6\' 1"  (185.4 cm) Weight: 290 lb (131.543 kg) IBW/kg (Calculated) : 79.9  Vital Signs: Temp: 98.2 F (36.8 C) (11/13 1624) Temp src: Oral (11/13 1624) BP: 131/63 mmHg (11/13 1624) Pulse Rate: 94 (11/13 1624) Intake/Output from previous day:   Intake/Output from this shift:    Labs:  Recent Labs  09/08/13 1258 09/08/13 1410  WBC 22.7*  --   HGB 5.4*  --   PLT 415*  --   CREATININE 7.25* 7.47*   Estimated Creatinine Clearance: 19.4 ml/min (by C-G formula based on Cr of 7.47). No results found for this basename: VANCOTROUGH, VANCOPEAK, VANCORANDOM, GENTTROUGH, GENTPEAK, GENTRANDOM, TOBRATROUGH, TOBRAPEAK, TOBRARND, AMIKACINPEAK, AMIKACINTROU, AMIKACIN,  in the last 72 hours   Microbiology: No results found for this or any previous visit (from the past 720 hour(s)).  Medical History: Past Medical History  Diagnosis Date  . Diabetes mellitus   . Hypertension   . Glaucoma (increased eye pressure) 2011  . Microcytic anemia 04/25/2013  . 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  [facility].  Xray today reveals osteomyelitis of LLE (left 5th metatarsal and proximal left 5th phalanx).   Renal status is worsened from patient's baseline due to hyperglycemia (baseline Scr ~2.5).    Normalized CrCl ~ 10-48ml/min.  Vancomycin 11/13>> Zosyn 11/13>>  Goal of Therapy:  Vancomycin trough level 15-20 mcg/ml  Plan:  Zosyn 2.25gm IV q6h Vancomycin 2gm IV load then 1500mg  IV q48h Check Vancomycin trough at steady state Monitor renal function and cx data   Elson Clan 09/08/2013,4:59 PM

## 2013-09-08 NOTE — Progress Notes (Signed)
09/08/13 1814 Notified Dr. Janna Arch (on-call for Dr. Felecia Shelling) of patient admission. Stated to notify Dr. Felecia Shelling for admission orders. Notified Dr. Felecia Shelling of arrival to unit, need admission orders. States will address admission orders ,continue insulin drip per glucostabilizer protocol. MD aware of order to transfuse 2 units PRBCs. Order received for diabetic diet. Earnstine Regal, RN

## 2013-09-08 NOTE — ED Notes (Signed)
CRITICAL VALUE ALERT  Critical value received:  Sodium 117, glucose 572  Date of notification:  09/08/13  Time of notification:  1536  Critical value read back:yes  Nurse who received alert:  c Shylie Polo  MD notified (1st page):    Time of first page:    MD notified (2nd page):  Time of second page:  Responding MD:  Dr Fayrene Fearing  Time MD responded:  1536

## 2013-09-08 NOTE — ED Provider Notes (Addendum)
CSN: [id number]     Arrival date & time 09/08/13  1214 History  This chart was scribed for Roney Marion, MD by Quintella Reichert, ED scribe.  This patient was seen in room APA02/APA02 and the patient's care was started at 1:49 PM.   Chief Complaint  Patient presents with  . Hyperglycemia    The history is provided by the patient. No language interpreter was used.    HPI Comments: Donald Sampson is a 37 y.o. male sent from PCP's office to the Emergency Department complaining of hyperglycemia over 500.  Pt states that he checks his blood sugar on Tuesdays, Thursdays and Fridays it has been normally under 200.  He states that it was under 200 on his monitor this morning but today today at his PCP's office for a regular checkup he was told it was over 500 and advised to come to the ED.  He states he feels well and denies weakness, fatigue, cough, SOB, vomiting, diarrhea, urinary symptoms, or pain to any area including head, neck, chest, of feet.  He has poor vision in both eyes due to his DM and states it has been slowly worsening but denies any recent acute changes.  He uses eye drops for glaucoma.  He has chronic numbness in both feet due to diabetic neuropathy which he states is unchanged.  He is eating and drinking normally and moving his bowels regularly.  Pt is still being treated for bilateral foot ulcers from burning his feet this summer and  He states he is still leaking blood.  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  He is seen 1x/week by a doctor and 3x/week by a home health nurse for this.  Pt has never been on dialysis   Past Medical History  Diagnosis Date  . Diabetes mellitus   . Hypertension   . Glaucoma (increased eye pressure) 2011  . Microcytic anemia 04/25/2013  . 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 Refractive surgery      No family history on file.   History  Substance Use Topics  . Smoking status: Never  Smoker   . Smokeless tobacco: Never Used  . Alcohol Use: No     Review of Systems  Constitutional: Negative for fever, chills, diaphoresis, appetite change and fatigue.  HENT: Negative for mouth sores, sore throat and trouble swallowing.   Respiratory: Negative for cough, chest tightness, shortness of breath and wheezing.   Cardiovascular: Negative for chest pain.  Gastrointestinal: Negative for nausea, vomiting, abdominal pain, diarrhea and abdominal distention.  Endocrine: Negative for polydipsia, polyphagia and polyuria.  Genitourinary: Negative for dysuria, frequency and hematuria.  Musculoskeletal: Negative for gait problem.  Skin: Positive for wound. Negative for color change, pallor and rash.  Neurological: Positive for numbness. Negative for dizziness, syncope, light-headedness and headaches.  Hematological: Does not bruise/bleed easily.  Psychiatric/Behavioral: Negative for behavioral problems and confusion.     Allergies  Review of patient's allergies indicates no known allergies.  Home Medications   Current Outpatient Rx  Name  Route  Sig  Dispense  Refill  . amLODipine (NORVASC) 5 MG tablet   Oral   Take 5 mg by mouth daily.         . furosemide (LASIX) 20 MG tablet   Oral   Take 20 mg by mouth 2 (two) times daily.         Marland Kitchen glyBURIDE (DIABETA) 5 MG tablet  Oral   Take 5 mg by mouth daily with breakfast.         . insulin glargine (LANTUS) 100 UNIT/ML injection   Subcutaneous   Inject 30 Units into the skin at bedtime.          Marland Kitchen levofloxacin (LEVAQUIN) 500 MG tablet   Oral   Take 500 mg by mouth daily.         Marland Kitchen linagliptin (TRADJENTA) 5 MG TABS tablet   Oral   Take 5 mg by mouth daily.         Marland Kitchen lisinopril (PRINIVIL,ZESTRIL) 20 MG tablet   Oral   Take 20 mg by mouth daily.         . brimonidine (ALPHAGAN) 0.2 % ophthalmic solution   Left Eye   Place 1 drop into the left eye 3 (three) times daily.          BP 124/73  Pulse  98  Temp(Src) 98 F (36.7 C) (Oral)  Resp 24  Ht 6\' 1"  (1.854 m)  Wt 290 lb (131.543 kg)  BMI 38.27 kg/m2  SpO2 97%  Physical Exam  Nursing note and vitals reviewed. Constitutional: He is oriented to person, place, and time. He appears well-developed and well-nourished. No distress.  HENT:  Head: Normocephalic.  Eyes: Conjunctivae are normal. Pupils are equal, round, and reactive to light. No scleral icterus.  Left eye with opacity and irregular pupil. CBC has history of "bad glaucoma and "  Neck: Normal range of motion. Neck supple. No thyromegaly present.  Cardiovascular: Normal rate and regular rhythm.  Exam reveals no gallop and no friction rub.   No murmur heard. Pulmonary/Chest: Effort normal and breath sounds normal. No respiratory distress. He has no wheezes. He has no rales.  Abdominal: Soft. Bowel sounds are normal. He exhibits no distension. There is no tenderness. There is no rebound.  Musculoskeletal: Normal range of motion.  Marked swelling of entire left foot.  Ulcer and granulation tissue under the 5th digit. Large ulcer under 5th digit of right foot, with granulation tissue adjacent to 5th digit.  Neurological: He is alert and oriented to person, place, and time.  Skin: Skin is warm and dry. No rash noted.  Psychiatric: He has a normal mood and affect. His behavior is normal.    ED Course  Procedures (including critical care time)  DIAGNOSTIC STUDIES: Oxygen Saturation is 97% on room air, normal by my interpretation.    COORDINATION OF CARE: 1:58 PM: Informed pt that labs are abnormal and reveal critically high glucose and critically low sodium.  Discussed treatment plan which includes repeat labs and likely admission.  Pt expressed understanding and agreed to plan.   Labs Review Labs Reviewed  BASIC METABOLIC PANEL - Abnormal; Notable for the following:    Sodium 116 (*)    Chloride 80 (*)    CO2 18 (*)    Glucose, Bld 572 (*)    BUN 77 (*)     Creatinine, Ser 7.25 (*)    Calcium 8.1 (*)    GFR calc non Af Amer 9 (*)    GFR calc Af Amer 10 (*)    All other components within normal limits  GLUCOSE, CAPILLARY - Abnormal; Notable for the following:    Glucose-Capillary 527 (*)    All other components within normal limits  URINALYSIS, ROUTINE W REFLEX MICROSCOPIC - Abnormal; Notable for the following:    Glucose, UA 500 (*)    Hgb urine dipstick  LARGE (*)    Protein, ur 100 (*)    All other components within normal limits  COMPREHENSIVE METABOLIC PANEL - Abnormal; Notable for the following:    Sodium 117 (*)    Chloride 81 (*)    Glucose, Bld 572 (*)    BUN 78 (*)    Creatinine, Ser 7.47 (*)    Calcium 7.9 (*)    Albumin 1.6 (*)    AST 43 (*)    Alkaline Phosphatase 363 (*)    GFR calc non Af Amer 8 (*)    GFR calc Af Amer 10 (*)    All other components within normal limits  CBC WITH DIFFERENTIAL - Abnormal; Notable for the following:    WBC 22.7 (*)    RBC 2.43 (*)    Hemoglobin 5.4 (*)    HCT 16.7 (*)    MCV 68.7 (*)    MCH 22.2 (*)    Platelets 415 (*)    Neutrophils Relative % 88 (*)    Lymphocytes Relative 2 (*)    Neutro Abs 19.9 (*)    Lymphs Abs 0.5 (*)    Monocytes Absolute 2.3 (*)    All other components within normal limits  GLUCOSE, CAPILLARY - Abnormal; Notable for the following:    Glucose-Capillary 539 (*)    All other components within normal limits  URINE MICROSCOPIC-ADD ON - Abnormal; Notable for the following:    Squamous Epithelial / LPF FEW (*)    Bacteria, UA FEW (*)    All other components within normal limits  RETICULOCYTES - Abnormal; Notable for the following:    RBC. 2.44 (*)    All other components within normal limits  GLUCOSE, CAPILLARY - Abnormal; Notable for the following:    Glucose-Capillary 531 (*)    All other components within normal limits  GLUCOSE, CAPILLARY - Abnormal; Notable for the following:    Glucose-Capillary 459 (*)    All other components within normal  limits  KETONES, QUALITATIVE  CBC WITH DIFFERENTIAL  IRON AND TIBC  VITAMIN B12  FOLATE  TYPE AND SCREEN  PREPARE RBC (CROSSMATCH)    Imaging Review Dg Foot Complete Left  09/08/2013   CLINICAL DATA:  Diabetes.  Swelling.  Possible osteomyelitis.  EXAM: LEFT FOOT - COMPLETE 3+ VIEW  COMPARISON:  MRI foot 06/17/2013.  FINDINGS: Extensive soft tissue swelling is noted about the left foot particularly over the left 5th digit and lateral aspect of the foot. Subcutaneous air is noted adjacent to the left 5th metatarsal. Destructive changes are noted of the proximal and distal portions of the 5th metatarsal and proximal left 5th phalanx. These findings are consistent with persistent or recurrent osteomyelitis of the left 5th digit with adjacent cellulitis and possible soft tissue abscess. The osteomyelitis most likely has a chronic component as callus formation is present.  IMPRESSION: Extensive changes suggesting osteomyelitis of the left 5th metatarsal and proximal left 5th phalanx. Severe adjacent soft tissue swelling and soft tissue air is noted suggesting adjacent cellulitis and possible soft tissue abscess. These results will be called to the ordering clinician or representative by the Radiologist Assistant, and communication documented in the PACS Dashboard.   Electronically Signed   By: Maisie Fus  Register   On: 09/08/2013 14:45   Dg Foot Complete Right  09/08/2013   CLINICAL DATA:  Right foot pain and swelling.  History of diabetes.  EXAM: RIGHT FOOT COMPLETE - 3+ VIEW  COMPARISON:  None  FINDINGS: There is no  evidence of acute fracture, subluxation or dislocation.  No radiographic evidence of osteomyelitis is noted.  The joint spaces are within normal limits.  No focal bony abnormalities are present.  The Lisfranc joints are unremarkable.  Dorsal soft tissue swelling is present.  IMPRESSION: Soft tissue swelling without bony abnormality.   Electronically Signed   By: Laveda Abbe M.D.   On: 09/08/2013  14:40     EKG Interpretation   None       MDM   1. Renal failure   2. Hyperglycemia   3. Osteomyelitis   4. Hyponatremia      Blood sugars over 500. Worsening acute on chronic renal insufficiency with creatinine of over 7. Sodium of 116. Repeated and confirmed. X-rays show osteomyelitis of the left foot the fifth ray. Hemoglobin of 5. Down from a baseline of 8.  I gave him suq-q insulin and 1 l bolus of NS.  His Anion Gap is only 16.  Will repeat CBG 1 hour after insulin and fluids.  I have placed a call to Hospitalist regarding admission.  17:00:  Hospitalists are capped.  I spoke with pts PCP Dr. Felecia Shelling.  Pt and results discussed.  Pt t receive, transfusion, IV antibiotics.  I have consulted Dr. Kristian Covey of Nephrology, he will see pt in ICU. I also spoke with Dr. Romeo Apple of Orthopedics regarding the Left foot osteomyelitis.  He also will see the patient in consult.    I personally performed the services described in this documentation, which was scribed in my presence. The recorded information has been reviewed and is accurate.    Roney Marion, MD 09/08/13 1706  Roney Marion, MD 09/08/13 1706  Roney Marion, MD 09/08/13 [phone]

## 2013-09-08 NOTE — ED Notes (Signed)
CRITICAL VALUE ALERT  Critical value received:  hgb 5.4, hct 16.7  Date of notification:  09/08/13  Time of notification:  1439  Critical value read back:yes  Nurse who received alert:  c edwards rn  MD notified (1st page):    Time of first page:    MD notified (2nd page):  Time of second page:  Responding MD:  Dr Fayrene Fearing  Time MD responded:  [phone]

## 2013-09-09 ENCOUNTER — Inpatient Hospital Stay (HOSPITAL_COMMUNITY): Payer: Medicaid Other

## 2013-09-09 DIAGNOSIS — M869 Osteomyelitis, unspecified: Secondary | ICD-10-CM

## 2013-09-09 LAB — GLUCOSE, CAPILLARY
Glucose-Capillary: 203 mg/dL — ABNORMAL HIGH (ref 70–99)
Glucose-Capillary: 190 mg/dL — ABNORMAL HIGH (ref 70–99)
Glucose-Capillary: 161 mg/dL — ABNORMAL HIGH (ref 70–99)
Glucose-Capillary: 149 mg/dL — ABNORMAL HIGH (ref 70–99)
Glucose-Capillary: 138 mg/dL — ABNORMAL HIGH (ref 70–99)
Glucose-Capillary: 119 mg/dL — ABNORMAL HIGH (ref 70–99)
Glucose-Capillary: 132 mg/dL — ABNORMAL HIGH (ref 70–99)
Glucose-Capillary: 172 mg/dL — ABNORMAL HIGH (ref 70–99)
Glucose-Capillary: 253 mg/dL — ABNORMAL HIGH (ref 70–99)
Glucose-Capillary: 260 mg/dL — ABNORMAL HIGH (ref 70–99)
Glucose-Capillary: 161 mg/dL — ABNORMAL HIGH (ref 70–99)
Glucose-Capillary: 206 mg/dL — ABNORMAL HIGH (ref 70–99)
Glucose-Capillary: 191 mg/dL — ABNORMAL HIGH (ref 70–99)
Glucose-Capillary: 186 mg/dL — ABNORMAL HIGH (ref 70–99)

## 2013-09-09 LAB — CBC
HCT: 19.7 % — ABNORMAL LOW (ref 39.0–52.0)
Platelets: 382 10*3/uL (ref 150–400)
RBC: 2.76 MIL/uL — ABNORMAL LOW (ref 4.22–5.81)
RDW: 16.8 % — ABNORMAL HIGH (ref 11.5–15.5)
WBC: 19.7 10*3/uL — ABNORMAL HIGH (ref 4.0–10.5)

## 2013-09-09 LAB — BASIC METABOLIC PANEL
BUN: 74 mg/dL — ABNORMAL HIGH (ref 6–23)
CO2: 20 mEq/L (ref 19–32)
Chloride: 90 mEq/L — ABNORMAL LOW (ref 96–112)
Creatinine, Ser: 7.03 mg/dL — ABNORMAL HIGH (ref 0.50–1.35)
GFR calc Af Amer: 10 mL/min — ABNORMAL LOW (ref >90)
GFR calc non Af Amer: 9 mL/min — ABNORMAL LOW (ref >90)
Potassium: 3.6 mEq/L (ref 3.5–5.1)

## 2013-09-09 LAB — IRON AND TIBC
Iron: <10 ug/dL — ABNORMAL LOW (ref 42–135)
UIBC: 140 ug/dL (ref 125–400)

## 2013-09-09 LAB — PREPARE RBC (CROSSMATCH)

## 2013-09-09 LAB — HEMOGLOBIN A1C
Hgb A1c MFr Bld: 8 % — ABNORMAL HIGH (ref <5.7)
Mean Plasma Glucose: 183 mg/dL — ABNORMAL HIGH (ref <117)

## 2013-09-09 LAB — VITAMIN B12: Vitamin B-12: 768 pg/mL (ref 211–911)

## 2013-09-09 LAB — PHOSPHORUS: Phosphorus: 5.4 mg/dL — ABNORMAL HIGH (ref 2.3–4.6)

## 2013-09-09 LAB — FOLATE: Folate: 4.9 ng/mL

## 2013-09-09 LAB — T4, FREE: Free T4: 1.58 ng/dL (ref 0.80–1.80)

## 2013-09-09 LAB — TSH: TSH: 4.176 u[IU]/mL (ref 0.350–4.500)

## 2013-09-09 LAB — FERRITIN: Ferritin: 1176 ng/mL — ABNORMAL HIGH (ref 22–322)

## 2013-09-09 MED ORDER — DEXTROSE-NACL 5-0.9 % IV SOLN
INTRAVENOUS | Status: DC
  Administered 2013-09-09: 10:00:00 via INTRAVENOUS

## 2013-09-09 MED ORDER — SODIUM CHLORIDE 0.9 % IV SOLN: INTRAVENOUS | Status: DC

## 2013-09-09 MED ORDER — FUROSEMIDE 10 MG/ML IJ SOLN
100.0000 mg | Freq: Two times a day (BID) | INTRAVENOUS | Status: DC
  Administered 2013-09-09 – 2013-09-10 (×4): 100 mg via INTRAVENOUS
  Filled 2013-09-09 (×7): qty 10

## 2013-09-09 MED ORDER — PIPERACILLIN-TAZOBACTAM IN DEX 2-0.25 GM/50ML IV SOLN
INTRAVENOUS | Status: AC
  Filled 2013-09-09: qty 50

## 2013-09-09 MED ORDER — INSULIN REGULAR BOLUS VIA INFUSION
0.0000 [IU] | Freq: Three times a day (TID) | INTRAVENOUS | Status: DC
  Administered 2013-09-09: 7.5 [IU] via INTRAVENOUS
  Filled 2013-09-09: qty 10

## 2013-09-09 MED ORDER — PIPERACILLIN-TAZOBACTAM IN DEX 2-0.25 GM/50ML IV SOLN
INTRAVENOUS | Status: AC
  Filled 2013-09-09: qty 100

## 2013-09-09 NOTE — Progress Notes (Signed)
Consult called to Dr. Nolen Mu. MD to be in later this evening to see patient.

## 2013-09-09 NOTE — Progress Notes (Signed)
Dr. Felecia Shelling notified that MRI of foot cannot be done at this time because of his insulin gtt and that MRI is closed through the weekend. MD acknowledged and stated that working on patient hyperglycemia and kidney function was most important at this time and to continue current treatments. MRI can be performed on Monday.

## 2013-09-09 NOTE — Progress Notes (Addendum)
Subjective: Interval History: has no complaint of nausea or vomiting. Patient also denies any difficulty in breathing. Presently patient claims is feeling better..  Objective: Vital signs in last 24 hours: Temp:  [98 F (36.7 C)-99 F (37.2 C)] 98 F (36.7 C) (11/14 0400) Pulse Rate:  [75-100] 90 (11/14 0600) Resp:  [7-32] 20 (11/14 0600) BP: (115-150)/(50-73) 129/68 mmHg (11/14 0600) SpO2:  [93 %-100 %] 95 % (11/14 0600) Weight:  [131.543 kg (290 lb)-135.1 kg (297 lb 13.5 oz)] 135.1 kg (297 lb 13.5 oz) (11/14 0400) Weight change:   Intake/Output from previous day: 11/13 0701 - 11/14 0700 In: 3274.3 [P.O.:600; I.V.:1241.8; Blood:982.5; IV Piggyback:450] Out: -  Intake/Output this shift:    General appearance: alert, cooperative and no distress Resp: clear to auscultation bilaterally Cardio: regular rate and rhythm, S1, S2 normal, no murmur, click, rub or gallop GI: soft, non-tender; bowel sounds normal; no masses,  no organomegaly Extremities: Patient with trace edema. He has also a lateral foot ulcer  Lab Results:  Recent Labs  09/08/13 1258 09/09/13 0600  WBC 22.7* 19.7*  HGB 5.4* 6.7*  HCT 16.7* 19.7*  PLT 415* 382   BMET:  Recent Labs  09/08/13 1410 09/09/13 0503  NA 117* 125*  K 4.3 3.6  CL 81* 90*  CO2 19 20  GLUCOSE 572* 153*  BUN 78* 74*  CREATININE 7.47* 7.03*  CALCIUM 7.9* 8.1*   No results found for this basename: PTH,  in the last 72 hours Iron Studies:  Recent Labs  09/08/13 1541  IRON <10*  TIBC Not calculated due to Iron <10.    Studies/Results: Dg Foot Complete Left  09/08/2013   CLINICAL DATA:  Diabetes.  Swelling.  Possible osteomyelitis.  EXAM: LEFT FOOT - COMPLETE 3+ VIEW  COMPARISON:  MRI foot 06/17/2013.  FINDINGS: Extensive soft tissue swelling is noted about the left foot particularly over the left 5th digit and lateral aspect of the foot. Subcutaneous air is noted adjacent to the left 5th metatarsal. Destructive changes are  noted of the proximal and distal portions of the 5th metatarsal and proximal left 5th phalanx. These findings are consistent with persistent or recurrent osteomyelitis of the left 5th digit with adjacent cellulitis and possible soft tissue abscess. The osteomyelitis most likely has a chronic component as callus formation is present.  IMPRESSION: Extensive changes suggesting osteomyelitis of the left 5th metatarsal and proximal left 5th phalanx. Severe adjacent soft tissue swelling and soft tissue air is noted suggesting adjacent cellulitis and possible soft tissue abscess. These results will be called to the ordering clinician or representative by the Radiologist Assistant, and communication documented in the PACS Dashboard.   Electronically Signed   By: Maisie Fus  Register   On: 09/08/2013 14:45   Dg Foot Complete Right  09/08/2013   CLINICAL DATA:  Right foot pain and swelling.  History of diabetes.  EXAM: RIGHT FOOT COMPLETE - 3+ VIEW  COMPARISON:  None  FINDINGS: There is no evidence of acute fracture, subluxation or dislocation.  No radiographic evidence of osteomyelitis is noted.  The joint spaces are within normal limits.  No focal bony abnormalities are present.  The Lisfranc joints are unremarkable.  Dorsal soft tissue swelling is present.  IMPRESSION: Soft tissue swelling without bony abnormality.   Electronically Signed   By: Laveda Abbe M.D.   On: 09/08/2013 14:40    I have reviewed the patient's current medications.  Assessment/Plan: Problem #1 acute kidney injury superimposed on chronic his BUN and  creatinine presently seems to be improving. This could be secondary to prerenal syndrome versus ATN. Problem #2 chronic renal failure stage IV. Presently patient doesn't have any uremic sinus symptoms. This was thought to be secondary to diabetes/hypertension. Problem #3 hypertension his blood pressure seems to be reasonably controlled Problem #4 diabetes. Has poorly controlled. His blood glucose is  better today. Problem #5 hyponatremia sodium has improved. 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 His hemoglobin and hematocrit remains low. His iron saturation is low but ferritin is pending. Problem #7 history of proteinuria. Problem #8 cellulitis of his his feet. Presently is on antibiotics. Patient her febrile but still with leukocytosis.  Problem #9 metabolic bone disease his calcium is was in acceptable range phosphorus slightly high. Presently controlled with diet. Plan: Continue his normal saline at 125 cc per hour           Will start patient on Lasix 100 mg IV twice a day.           We'll check his basic metabolic panel in the morning.            Once we get his ferritin will make a decision about IV iron.   LOS: 1 day   Donald Sampson S 09/09/2013,7:41 AM

## 2013-09-09 NOTE — Care Management Note (Addendum)
    Page 1 of 2   09/12/2013     3:22:06 PM   CARE MANAGEMENT NOTE 09/12/2013  Patient:  Donald Sampson, Donald Sampson   Account Number:  [id number]  Date Initiated:  09/09/2013  Documentation initiated by:  Sharrie Rothman  Subjective/Objective Assessment:   Pt admitted from home with DKA. Pt lives with family and will return home at discharge. Pt is active with [facility] RN and receives wound care at clinic in [place] weekly. No DME needs.     Action/Plan:   Will arrange resumption of HH at discharge. IF d/c over the weekend staff will arrange. No other CM needs noted.   Anticipated DC Date:  09/12/2013   Anticipated DC Plan:  HOME W HOME HEALTH SERVICES      DC Planning Services  CM consult      [facility] Choice  Resumption Of Svcs/PTA Provider   Choice offered to / List presented to:  C-1 Patient        HH arranged  HH-1 RN      [facility] agency  [facility]   Status of service:  Completed, signed off Medicare Important Message given?   (If response is "NO", the following Medicare IM given date fields will be blank) Date Medicare IM given:   Date Additional Medicare IM given:    Discharge Disposition:  HOME W HOME HEALTH SERVICES  Per UR Regulation:    If discussed at Long Length of Stay Meetings, dates discussed:    Comments:  09/12/13 Rosemary Holms RN BSN CM Pt going home with [facility] RN for IV therapy for at least 2 weeks. Pt's significant other will be able to assist with the IV. [facility] notifed of DC  11/141/4 1414 Arlyss Queen, Charity fundraiser BSN CM

## 2013-09-09 NOTE — H&P (Signed)
Donald Sampson MRN: [id number] DOB/AGE: December 10, 1975 37 y.o. Primary Care Physician:Kati Riggenbach, MD Admit date: 09/08/2013 Chief Complaint:  Very high blood sugar HPI:  This is 37 years old male patient with history of multiple medical illnesses who is noncompliant on his treatment was seen in my office for routine follow up. However, during the visit his blood sugar was found to be over 500 mg/dl. He also has bilateral dressed feet wound which is being followed by podiatrist in [place]. Patient was referred to Er and was evaluated. His blood sugar was 570, with very low Na+, CO2 and elevated BUN and Cr. Patient was started on insulin drip and Iv fluid and admitted to ICU. No fever, chills, cough, chest pain, shortness of breath, nausea, vomiting or abdominal pain.  Past Medical History  Diagnosis Date  . Diabetes mellitus   . Hypertension   . Glaucoma (increased eye pressure) 2011  . Microcytic anemia 04/25/2013  . 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 Refractive surgery          No family history on file.  Social History:  reports that he has never smoked. He has never used smokeless tobacco. He reports that he does not drink alcohol or use illicit drugs.   Allergies: No Known Allergies  Medications Prior to Admission  Medication Sig Dispense Refill  . amLODipine (NORVASC) 5 MG tablet Take 5 mg by mouth daily.      . furosemide (LASIX) 20 MG tablet Take 20 mg by mouth 2 (two) times daily.      Marland Kitchen glyBURIDE (DIABETA) 5 MG tablet Take 5 mg by mouth daily with breakfast.      . insulin glargine (LANTUS) 100 UNIT/ML injection Inject 30 Units into the skin at bedtime.       Marland Kitchen levofloxacin (LEVAQUIN) 500 MG tablet Take 500 mg by mouth daily.      Marland Kitchen linagliptin (TRADJENTA) 5 MG TABS tablet Take 5 mg by mouth daily.      Marland Kitchen lisinopril (PRINIVIL,ZESTRIL) 20 MG tablet Take 20 mg by mouth daily.      . brimonidine (ALPHAGAN) 0.2 % ophthalmic  solution Place 1 drop into the left eye 3 (three) times daily.           VHQ:IONGE from the symptoms mentioned above,there are no other symptoms referable to all systems reviewed.  Physical Exam: Blood pressure 139/67, pulse 93, temperature 98 F (36.7 C), temperature source Oral, resp. rate 25, height 6\' 1"  (1.854 m), weight 135.1 kg (297 lb 13.5 oz), SpO2 94.00%. General condition- alert, awake, apathetic looking HE ENT- poor vision bilateral, pupils are splashily reactive Chest- good air entry, clear lung field CVS- S1 and S2 heard, no murmur or gallop ABD- soft and lax, bowel sound ++, no mass organomegaly EXT- bilaterally dressed foot ulcers   Recent Labs  09/08/13 1258 09/09/13 0600  WBC 22.7* 19.7*  NEUTROABS 19.9*  --   HGB 5.4* 6.7*  HCT 16.7* 19.7*  MCV 68.7* 71.4*  PLT 415* 382    Recent Labs  09/08/13 1410 09/09/13 0503  NA 117* 125*  K 4.3 3.6  CL 81* 90*  CO2 19 20  GLUCOSE 572* 153*  BUN 78* 74*  CREATININE 7.47* 7.03*  CALCIUM 7.9* 8.1*  lablast2(ast:2,ALT:2,alkphos:2,bilitot:2,prot:2,albumin:2)@    Recent Results (from the past 240 hour(s))  MRSA PCR SCREENING     Status: None   Collection Time    09/08/13  5:29 PM  Result Value Range Status   MRSA by PCR NEGATIVE  NEGATIVE Final   Comment:            The GeneXpert MRSA Assay (FDA     approved for NASAL specimens     only), is one component of a     comprehensive MRSA colonization     surveillance program. It is not     intended to diagnose MRSA     infection nor to guide or     monitor treatment for     MRSA infections.     Dg Foot Complete Left  09/08/2013   CLINICAL DATA:  Diabetes.  Swelling.  Possible osteomyelitis.  EXAM: LEFT FOOT - COMPLETE 3+ VIEW  COMPARISON:  MRI foot 06/17/2013.  FINDINGS: Extensive soft tissue swelling is noted about the left foot particularly over the left 5th digit and lateral aspect of the foot. Subcutaneous air is noted adjacent to the left 5th  metatarsal. Destructive changes are noted of the proximal and distal portions of the 5th metatarsal and proximal left 5th phalanx. These findings are consistent with persistent or recurrent osteomyelitis of the left 5th digit with adjacent cellulitis and possible soft tissue abscess. The osteomyelitis most likely has a chronic component as callus formation is present.  IMPRESSION: Extensive changes suggesting osteomyelitis of the left 5th metatarsal and proximal left 5th phalanx. Severe adjacent soft tissue swelling and soft tissue air is noted suggesting adjacent cellulitis and possible soft tissue abscess. These results will be called to the ordering clinician or representative by the Radiologist Assistant, and communication documented in the PACS Dashboard.   Electronically Signed   By: Maisie Fus  Register   On: 09/08/2013 14:45   Dg Foot Complete Right  09/08/2013   CLINICAL DATA:  Right foot pain and swelling.  History of diabetes.  EXAM: RIGHT FOOT COMPLETE - 3+ VIEW  COMPARISON:  None  FINDINGS: There is no evidence of acute fracture, subluxation or dislocation.  No radiographic evidence of osteomyelitis is noted.  The joint spaces are within normal limits.  No focal bony abnormalities are present.  The Lisfranc joints are unremarkable.  Dorsal soft tissue swelling is present.  IMPRESSION: Soft tissue swelling without bony abnormality.   Electronically Signed   By: Laveda Abbe M.D.   On: 09/08/2013 14:40   Impression: 1.Diabetic Ketoacidosis 2. Hyponatremia 3. Acute on chronic renal failure 4. Hypertension 5. Bilateral foot ulcer 6. Osteomyelitis rt foot 7. Legally Blind 8. Anemia 9. Medical noncomplaince  Active Problems:   * No active hospital problems. *     Plan: Will continue insulin drip according protocol Continue iv fluid Continue Iv antibiotics nephrology consult appreciated Will do endocrine and orthopedic connsult.      Donald Sampson Pager [phone]  09/09/2013,  8:20 AM

## 2013-09-09 NOTE — Consult Note (Addendum)
Reason for Consult: left foot infection Referring Physician: DR Ortencia Kick is an 37 y.o. male.  HPI: Admit date: 09/08/2013 Chief Complaint:  Very high blood sugar HPI:  This is 37 years old male patient with history of multiple medical illnesses who is noncompliant on his treatment was seen in my office for routine follow up. However, during the visit his blood sugar was found to be over 500 mg/dl. He also has bilateral dressed feet wound which is being followed by podiatrist in [place]. Patient was referred to [facility] and was evaluated. His blood sugar was 570, with very low Na+, CO2 and elevated BUN and Cr. Patient was started on insulin drip and Iv fluid and admitted to ICU. No fever, chills, cough, chest pain, shortness of breath, nausea, vomiting or abdominal pain.  Addendum additional history noted the patient was followed in [place] at the wound care center. According to him his left foot seem to be improving and then over the last 2 weeks he started having swelling and drainage he noticed no fever but he did notice that he was not feeling well he was feeling tired and weak. According to him his foot was looking better.  Past Medical History  Diagnosis Date  . Diabetes mellitus   . Hypertension   . Glaucoma (increased eye pressure) 2011  . Microcytic anemia 04/25/2013  . 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 Refractive surgery      No family history on file.  Social History:  reports that he has never smoked. He has never used smokeless tobacco. He reports that he does not drink alcohol or use illicit drugs.  Allergies: No Known Allergies  Medications: I have reviewed the patient's current medications.  Results for orders placed during the hospital encounter of 09/08/13 (from the past 48 hour(s))  GLUCOSE, CAPILLARY     Status: Abnormal   Collection Time    09/08/13 12:27 PM      Result Value Range   Glucose-Capillary  527 (*) 70 - 99 mg/dL   Comment 1 Documented in Chart    BASIC METABOLIC PANEL     Status: Abnormal   Collection Time    09/08/13 12:58 PM      Result Value Range   Sodium 116 (*) 135 - 145 mEq/L   Comment: CRITICAL RESULT CALLED TO, READ BACK BY AND VERIFIED WITH:     EDWARDS,C AT 1338 BY HUFFINES,S ON 09/08/13   Potassium 4.1  3.5 - 5.1 mEq/L   Chloride 80 (*) 96 - 112 mEq/L   CO2 18 (*) 19 - 32 mEq/L   Glucose, Bld 572 (*) 70 - 99 mg/dL   Comment: CRITICAL RESULT CALLED TO, READ BACK BY AND VERIFIED WITH:     EDWARDS,C AT 1338 BY HUFFINES,S ON 09/08/13   BUN 77 (*) 6 - 23 mg/dL   Creatinine, Ser 1.91 (*) 0.50 - 1.35 mg/dL   Calcium 8.1 (*) 8.4 - 10.5 mg/dL   GFR calc non Af Amer 9 (*) >90 mL/min   GFR calc Af Amer 10 (*) >90 mL/min   Comment: (NOTE)     The eGFR has been calculated using the CKD EPI equation.     This calculation has not been validated in all clinical situations.     eGFR's persistently <90 mL/min signify possible Chronic Kidney     Disease.  CBC WITH DIFFERENTIAL     Status: Abnormal  Collection Time    09/08/13 12:58 PM      Result Value Range   WBC 22.7 (*) 4.0 - 10.5 K/uL   RBC 2.43 (*) 4.22 - 5.81 MIL/uL   Hemoglobin 5.4 (*) 13.0 - 17.0 g/dL   Comment: CRITICAL RESULT CALLED TO, READ BACK BY AND VERIFIED WITH:     EDWARDS,C. AT 1437 ON 09/08/2013 BY BAUGHAM,M.   HCT 16.7 (*) 39.0 - 52.0 %   Comment: RESULT REPEATED AND VERIFIED   MCV 68.7 (*) 78.0 - 100.0 fL   MCH 22.2 (*) 26.0 - 34.0 pg   MCHC 32.3  30.0 - 36.0 g/dL   RDW 40.9  81.1 - 91.4 %   Platelets 415 (*) 150 - 400 K/uL   Neutrophils Relative % 88 (*) 43 - 77 %   Lymphocytes Relative 2 (*) 12 - 46 %   Monocytes Relative 10  3 - 12 %   Eosinophils Relative 0  0 - 5 %   Basophils Relative 0  0 - 1 %   Neutro Abs 19.9 (*) 1.7 - 7.7 K/uL   Lymphs Abs 0.5 (*) 0.7 - 4.0 K/uL   Monocytes Absolute 2.3 (*) 0.1 - 1.0 K/uL   Eosinophils Absolute 0.0  0.0 - 0.7 K/uL   Basophils Absolute 0.0  0.0  - 0.1 K/uL   WBC Morphology INCREASED BANDS (>20% BANDS)     Comment: MILD LEFT SHIFT (1-5% METAS, OCC MYELO, OCC BANDS)     VACUOLATED NEUTROPHILS     DOHLE BODIES   Smear Review PLATELET COUNT CONFIRMED BY SMEAR     Comment: PLATELETS APPEAR INCREASED     LARGE PLATELETS PRESENT     GIANT PLATELETS SEEN  URINALYSIS, ROUTINE W REFLEX MICROSCOPIC     Status: Abnormal   Collection Time    09/08/13  2:10 PM      Result Value Range   Color, Urine YELLOW  YELLOW   APPearance CLEAR  CLEAR   Specific Gravity, Urine 1.025  1.005 - 1.030   pH 5.5  5.0 - 8.0   Glucose, UA 500 (*) NEGATIVE mg/dL   Hgb urine dipstick LARGE (*) NEGATIVE   Bilirubin Urine NEGATIVE  NEGATIVE   Ketones, ur NEGATIVE  NEGATIVE mg/dL   Protein, ur 782 (*) NEGATIVE mg/dL   Urobilinogen, UA 0.2  0.0 - 1.0 mg/dL   Nitrite NEGATIVE  NEGATIVE   Leukocytes, UA NEGATIVE  NEGATIVE  COMPREHENSIVE METABOLIC PANEL     Status: Abnormal   Collection Time    09/08/13  2:10 PM      Result Value Range   Sodium 117 (*) 135 - 145 mEq/L   Comment: CRITICAL RESULT CALLED TO, READ BACK BY AND VERIFIED WITH:     EDWARDS,C ON 09/08/13 AT 1535 BY LOY,C   Potassium 4.3  3.5 - 5.1 mEq/L   Chloride 81 (*) 96 - 112 mEq/L   CO2 19  19 - 32 mEq/L   Glucose, Bld 572 (*) 70 - 99 mg/dL   Comment: CRITICAL RESULT CALLED TO, READ BACK BY AND VERIFIED WITH:     EDWARDS,C ON 09/08/13 AT 1535 BY LOY,C   BUN 78 (*) 6 - 23 mg/dL   Creatinine, Ser 9.56 (*) 0.50 - 1.35 mg/dL   Calcium 7.9 (*) 8.4 - 10.5 mg/dL   Total Protein 6.8  6.0 - 8.3 g/dL   Albumin 1.6 (*) 3.5 - 5.2 g/dL   AST 43 (*) 0 - 37 U/L  ALT 43  0 - 53 U/L   Alkaline Phosphatase 363 (*) 39 - 117 U/L   Total Bilirubin 1.2  0.3 - 1.2 mg/dL   GFR calc non Af Amer 8 (*) >90 mL/min   GFR calc Af Amer 10 (*) >90 mL/min   Comment: (NOTE)     The eGFR has been calculated using the CKD EPI equation.     This calculation has not been validated in all clinical situations.     eGFR's  persistently <90 mL/min signify possible Chronic Kidney     Disease.  URINE MICROSCOPIC-ADD ON     Status: Abnormal   Collection Time    09/08/13  2:10 PM      Result Value Range   Squamous Epithelial / LPF FEW (*) RARE   WBC, UA 0-2  <3 WBC/hpf   RBC / HPF 7-10  <3 RBC/hpf   Bacteria, UA FEW (*) RARE   Urine-Other AMORPHOUS URATES/PHOSPHATES    KETONES, QUALITATIVE     Status: None   Collection Time    09/08/13  2:40 PM      Result Value Range   Acetone, Bld NEGATIVE  NEGATIVE  GLUCOSE, CAPILLARY     Status: Abnormal   Collection Time    09/08/13  2:56 PM      Result Value Range   Glucose-Capillary 539 (*) 70 - 99 mg/dL   Comment 1 Notify RN    IRON AND TIBC     Status: Abnormal   Collection Time    09/08/13  3:41 PM      Result Value Range   Iron <10 (*) 42 - 135 ug/dL   TIBC Not calculated due to Iron <10.  215 - 435 ug/dL   Saturation Ratios Not calculated due to Iron <10.  20 - 55 %   UIBC 140  125 - 400 ug/dL   Comment: Performed at [facility]  VITAMIN B12     Status: None   Collection Time    09/08/13  3:41 PM      Result Value Range   Vitamin B-12 768  211 - 911 pg/mL   Comment: Performed at [facility]  FOLATE     Status: None   Collection Time    09/08/13  3:41 PM      Result Value Range   Folate 4.9     Comment: (NOTE)     Reference Ranges            Deficient:       0.4 - 3.3 ng/mL            Indeterminate:   3.4 - 5.4 ng/mL            Normal:              > 5.4 ng/mL     Performed at [facility]  RETICULOCYTES     Status: Abnormal   Collection Time    09/08/13  3:41 PM      Result Value Range   Retic Ct Pct 1.4  0.4 - 3.1 %   RBC. 2.44 (*) 4.22 - 5.81 MIL/uL   Retic Count, Manual 34.2  19.0 - 186.0 K/uL  TYPE AND SCREEN     Status: None   Collection Time    09/08/13  3:45 PM      Result Value Range   ABO/RH(D) A POS     Antibody Screen NEG  Sample Expiration 09/11/2013     Unit Number [id number]     Blood  Component Type RED CELLS,LR     Unit division 00     Status of Unit ISSUED     Transfusion Status OK TO TRANSFUSE     Crossmatch Result Compatible     Unit Number [id number]     Blood Component Type RED CELLS,LR     Unit division 00     Status of Unit ISSUED     Transfusion Status OK TO TRANSFUSE     Crossmatch Result Compatible    ABO/RH     Status: None   Collection Time    09/08/13  3:45 PM      Result Value Range   ABO/RH(D) A POS    GLUCOSE, CAPILLARY     Status: Abnormal   Collection Time    09/08/13  3:55 PM      Result Value Range   Glucose-Capillary 531 (*) 70 - 99 mg/dL   Comment 1 Notify RN    PREPARE RBC (CROSSMATCH)     Status: None   Collection Time    09/08/13  4:30 PM      Result Value Range   Order Confirmation ORDER PROCESSED BY BLOOD BANK    GLUCOSE, CAPILLARY     Status: Abnormal   Collection Time    09/08/13  4:49 PM      Result Value Range   Glucose-Capillary 459 (*) 70 - 99 mg/dL  MRSA PCR SCREENING     Status: None   Collection Time    09/08/13  5:29 PM      Result Value Range   MRSA by PCR NEGATIVE  NEGATIVE   Comment:            The GeneXpert MRSA Assay (FDA     approved for NASAL specimens     only), is one component of a     comprehensive MRSA colonization     surveillance program. It is not     intended to diagnose MRSA     infection nor to guide or     monitor treatment for     MRSA infections.  GLUCOSE, CAPILLARY     Status: Abnormal   Collection Time    09/08/13  6:04 PM      Result Value Range   Glucose-Capillary 477 (*) 70 - 99 mg/dL   Comment 1 Notify RN     Comment 2 Documented in Chart    GLUCOSE, CAPILLARY     Status: Abnormal   Collection Time    09/08/13  7:00 PM      Result Value Range   Glucose-Capillary 429 (*) 70 - 99 mg/dL   Comment 1 Notify RN     Comment 2 Documented in Chart    GLUCOSE, CAPILLARY     Status: Abnormal   Collection Time    09/08/13  8:05 PM      Result Value Range   Glucose-Capillary 403  (*) 70 - 99 mg/dL  GLUCOSE, CAPILLARY     Status: Abnormal   Collection Time    09/08/13  9:20 PM      Result Value Range   Glucose-Capillary 353 (*) 70 - 99 mg/dL  GLUCOSE, CAPILLARY     Status: Abnormal   Collection Time    09/08/13 10:54 PM      Result Value Range   Glucose-Capillary 262 (*) 70 - 99 mg/dL  GLUCOSE, CAPILLARY  Status: Abnormal   Collection Time    09/08/13 11:57 PM      Result Value Range   Glucose-Capillary 218 (*) 70 - 99 mg/dL  GLUCOSE, CAPILLARY     Status: Abnormal   Collection Time    09/09/13  1:19 AM      Result Value Range   Glucose-Capillary 203 (*) 70 - 99 mg/dL  GLUCOSE, CAPILLARY     Status: Abnormal   Collection Time    09/09/13  2:09 AM      Result Value Range   Glucose-Capillary 190 (*) 70 - 99 mg/dL  BASIC METABOLIC PANEL     Status: Abnormal   Collection Time    09/09/13  5:03 AM      Result Value Range   Sodium 125 (*) 135 - 145 mEq/L   Comment: DELTA CHECK NOTED   Potassium 3.6  3.5 - 5.1 mEq/L   Chloride 90 (*) 96 - 112 mEq/L   CO2 20  19 - 32 mEq/L   Glucose, Bld 153 (*) 70 - 99 mg/dL   BUN 74 (*) 6 - 23 mg/dL   Creatinine, Ser 4.09 (*) 0.50 - 1.35 mg/dL   Calcium 8.1 (*) 8.4 - 10.5 mg/dL   GFR calc non Af Amer 9 (*) >90 mL/min   GFR calc Af Amer 10 (*) >90 mL/min   Comment: (NOTE)     The eGFR has been calculated using the CKD EPI equation.     This calculation has not been validated in all clinical situations.     eGFR's persistently <90 mL/min signify possible Chronic Kidney     Disease.  PHOSPHORUS     Status: Abnormal   Collection Time    09/09/13  5:03 AM      Result Value Range   Phosphorus 5.4 (*) 2.3 - 4.6 mg/dL    Dg Foot Complete Left  09/08/2013   CLINICAL DATA:  Diabetes.  Swelling.  Possible osteomyelitis.  EXAM: LEFT FOOT - COMPLETE 3+ VIEW  COMPARISON:  MRI foot 06/17/2013.  FINDINGS: Extensive soft tissue swelling is noted about the left foot particularly over the left 5th digit and lateral aspect of  the foot. Subcutaneous air is noted adjacent to the left 5th metatarsal. Destructive changes are noted of the proximal and distal portions of the 5th metatarsal and proximal left 5th phalanx. These findings are consistent with persistent or recurrent osteomyelitis of the left 5th digit with adjacent cellulitis and possible soft tissue abscess. The osteomyelitis most likely has a chronic component as callus formation is present.  IMPRESSION: Extensive changes suggesting osteomyelitis of the left 5th metatarsal and proximal left 5th phalanx. Severe adjacent soft tissue swelling and soft tissue air is noted suggesting adjacent cellulitis and possible soft tissue abscess. These results will be called to the ordering clinician or representative by the Radiologist Assistant, and communication documented in the PACS Dashboard.   Electronically Signed   By: Maisie Fus  Register   On: 09/08/2013 14:45   Dg Foot Complete Right  09/08/2013   CLINICAL DATA:  Right foot pain and swelling.  History of diabetes.  EXAM: RIGHT FOOT COMPLETE - 3+ VIEW  COMPARISON:  None  FINDINGS: There is no evidence of acute fracture, subluxation or dislocation.  No radiographic evidence of osteomyelitis is noted.  The joint spaces are within normal limits.  No focal bony abnormalities are present.  The Lisfranc joints are unremarkable.  Dorsal soft tissue swelling is present.  IMPRESSION: Soft tissue swelling  without bony abnormality.   Electronically Signed   By: Laveda Abbe M.D.   On: 09/08/2013 14:40    ROS Blood pressure 129/68, pulse 90, temperature 98 F (36.7 C), temperature source Oral, resp. rate 20, height 6\' 1"  (1.854 m), weight 297 lb 13.5 oz (135.1 kg), SpO2 95.00%. Physical Exam  Physical Exam(12)  1.GENERAL: Large African American male with obesity, I could not palpate a dorsalis pedis pulse he has  very hardened darkened skin on the top of his foot and ankle. He has a severely swollen foot and ankle with obvious odor and  drainage from the lateral portion of the foot with a large plantar ulcer. Is no lymphadenopathy or lymphangitis  He is awake alert and oriented mood and affect are normal  He has pressure sensation position sensation is questionable pain sensation is absent  His foot is plantigrade ankle range of motion is limited there is no tenderness noted to palpation there is drainage normal Achilles tendon function no instability at the ankle joint and his gait was not observed  Upper extremity exam  The right and left upper extremity:   Inspection and palpation revealed no abnormalities in the upper extremities.   Range of motion is full without contracture.  Motor exam is normal with grade 5 strength.  The joints are fully reduced without subluxation.  There is no atrophy or tremor and muscle tone is normal.  All joints are stable.   As far as the right foot goes he does have 2 ulcers on that foot as well but there is no swelling or drainage there is no odor. The foot is plantigrade and muscle tone is normal. Weak dorsalis pedis pulses noted. On the plantar fourth portion of the foot there is a small ulcer under the fifth metatarsal head and there is a dorsal ulcer over the dorsum of the great toe. Imaging the x-ray of his foot shows osteomyelitis with erosion of bone of the distal portion of the metatarsal bone.  Assessment: Diabetic foot ulcer with osteomyelitis and abscess and acute infection    Plan: Medical stabilization to control glucose, improved kidney function and creatinine, transfusion for anemia, further consultation with podiatry.   Assessment/Plan: Severe diabetic foot infection with abscess drainage and underlying osteomyelitis   PODIATRY TO SEE   CONCERN ABOUT ANEMIA, CR ELEVATION, GLUCOSE AND CHRONIC ANTIBIOTIC USE WITH RESISTANCE    Fuller Canada 09/09/2013, 7:23 AM

## 2013-09-09 NOTE — Progress Notes (Signed)
CRITICAL VALUE ALERT  Critical value received:  Hgb 6.7  Date of notification:  09/09/2013  Time of notification:  0725  Critical value read back:yes  Nurse who received alert:  T.Neilson,RN  MD notified (1st page):  [place]  Time of first page:  0730  MD notified (2nd page):  Time of second page:  Responding MD:  Felecia Shelling  Time MD responded:  0730  Orders given to give 2 units PRBCs

## 2013-09-09 NOTE — Consult Note (Signed)
WOC consult requested for foot wounds. X-ray indicates: These findings are consistent with persistent or recurrent osteomyelitis of the left 5th digit with adjacent cellulitis and possible soft tissue abscess. This is beyond [facility] scope of practice.  Please refer to ortho service for assessment and plan of care. Please re-consult if further assistance is needed.  Thank-you,  Cammie Mcgee MSN, RN, CWOCN, [place], CNS [phone]

## 2013-09-09 NOTE — Progress Notes (Signed)
Dr. Lovell Sheehan consulted called. MD stated to wait to consult Nolen Mu for now and would clarify later with nursing staff. Patient notified that general surgery would be coming in to talk with him at some time today but patient refused stating he only wanted his surgeon Dr. Paula Compton. Informed patient that Dr. Paula Compton did not come to [facility]. He said he would only see his doctor and would just see him when he leaves here. Dr. Felecia Shelling, Dr. Fransico Him also present at this time, notified of patients refusal. Will notify Dr. Lovell Sheehan.

## 2013-09-09 NOTE — Progress Notes (Signed)
UR chart review completed.  

## 2013-09-09 NOTE — Progress Notes (Signed)
Subjective: Patient was admitted to due to DKA, acute renal failure and bilateral foot ulcer. Patient also has anemia requiring transfusion. Patient feels better. He refused to general surgery for his infected foot ulcers.  Objective: Vital signs in last 24 hours: Temp:  [98 F (36.7 C)-99 F (37.2 C)] 98 F (36.7 C) (11/14 0400) Pulse Rate:  [75-100] 93 (11/14 0800) Resp:  [7-32] 25 (11/14 0800) BP: (115-150)/(50-73) 139/67 mmHg (11/14 0800) SpO2:  [93 %-100 %] 94 % (11/14 0800) Weight:  [131.543 kg (290 lb)-135.1 kg (297 lb 13.5 oz)] 135.1 kg (297 lb 13.5 oz) (11/14 0400) Weight change:  Last BM Date: 09/08/13  Intake/Output from previous day: 11/13 0701 - 11/14 0700 In: 3274.3 [P.O.:600; I.V.:1241.8; Blood:982.5; IV Piggyback:450] Out: -   PHYSICAL EXAM General appearance: no distress and uncooperative Resp: clear to auscultation bilaterally Cardio: S1, S2 normal GI: soft, non-tender; bowel sounds normal; no masses,  no organomegaly Extremities: dressed bilateral foot ulcer  Lab Results:    @labtest @ [facility] No results found for this basename: PHART, PCO2, PO2ART, TCO2, HCO3,  in the last 72 hours CULTURES Recent Results (from the past 240 hour(s))  MRSA PCR SCREENING     Status: None   Collection Time    09/08/13  5:29 PM      Result Value Range Status   MRSA by PCR NEGATIVE  NEGATIVE Final   Comment:            The GeneXpert MRSA Assay (FDA     approved for NASAL specimens     only), is one component of a     comprehensive MRSA colonization     surveillance program. It is not     intended to diagnose MRSA     infection nor to guide or     monitor treatment for     MRSA infections.   Studies/Results: Dg Foot Complete Left  09/08/2013   CLINICAL DATA:  Diabetes.  Swelling.  Possible osteomyelitis.  EXAM: LEFT FOOT - COMPLETE 3+ VIEW  COMPARISON:  MRI foot 06/17/2013.  FINDINGS: Extensive soft tissue swelling is noted about the left foot particularly over the  left 5th digit and lateral aspect of the foot. Subcutaneous air is noted adjacent to the left 5th metatarsal. Destructive changes are noted of the proximal and distal portions of the 5th metatarsal and proximal left 5th phalanx. These findings are consistent with persistent or recurrent osteomyelitis of the left 5th digit with adjacent cellulitis and possible soft tissue abscess. The osteomyelitis most likely has a chronic component as callus formation is present.  IMPRESSION: Extensive changes suggesting osteomyelitis of the left 5th metatarsal and proximal left 5th phalanx. Severe adjacent soft tissue swelling and soft tissue air is noted suggesting adjacent cellulitis and possible soft tissue abscess. These results will be called to the ordering clinician or representative by the Radiologist Assistant, and communication documented in the PACS Dashboard.   Electronically Signed   By: Maisie Fus  Register   On: 09/08/2013 14:45   Dg Foot Complete Right  09/08/2013   CLINICAL DATA:  Right foot pain and swelling.  History of diabetes.  EXAM: RIGHT FOOT COMPLETE - 3+ VIEW  COMPARISON:  None  FINDINGS: There is no evidence of acute fracture, subluxation or dislocation.  No radiographic evidence of osteomyelitis is noted.  The joint spaces are within normal limits.  No focal bony abnormalities are present.  The Lisfranc joints are unremarkable.  Dorsal soft tissue swelling is present.  IMPRESSION: Soft  tissue swelling without bony abnormality.   Electronically Signed   By: Laveda Abbe M.D.   On: 09/08/2013 14:40    Medications: I have reviewed the patient's current medications.  Assesment: 1.Diabetic Ketoacidosis  2. Hyponatremia  3. Acute on chronic renal failure  4. Hypertension  5. Bilateral foot ulcer  6. Osteomyelitis rt foot  7. Legally Blind  8. Anemia  9. Medical noncomplaince  Active Problems:   * No active hospital problems. *    Plan: Will continue insulin drip Endocrine consult is  pending Wound consult Continue iv antibiotics Will transfuse 2 units PRBC    LOS: 1 day   Jennifer Payes 09/09/2013, 8:39 AM

## 2013-09-09 NOTE — Consult Note (Signed)
Donald Sampson MRN: [id number] DOB/AGE: 1976-03-19 37 y.o. Primary Care Physician:FANTA,TESFAYE, MD Admit date: 09/08/2013 Chief Complaint:  Consult for DKA and chronically uncontrolled type 2 DM HPI:  Donald Sampson  is a 2- yr- old patient with medical history  as below. Patient is being seen in consultation for currently uncontrolled type 2 DM , admitted with DKA and bilateral diabetic foot ulcers, requested by Dr.  Felecia Shelling. Patient was supposed to see me as out patient, but no showed, and never saw me for his diabetes.  Patient was diagnosed with type 2 DM approximately 15 yrs ago. Patient's A1c  In July was 10 .6% consistent with uncontrolled status.    Patient  Was sent to ER from Dr. Letitia Neri office due to severe hyperglycemia above 500. He reportsed polydipsia, polyuria, and nocturia prior to admission. He was found to have DKA at ER and was put on insulin drip with subsequent stabilization of his glycemia x 13 hour now. Pt is known to have serious non compliance to out patient diabetes therapy.  He was supposed to be on lantus , glyburide, and tradjenta.  Patient has complication including advance CKD on nephrology f/u, retinopathy with severe visual impairment , PAD with bilateral diabetic feet ulcers.  Pt gives a  family hx of type 2 DM .  Past Medical History  Diagnosis Date  . Diabetes mellitus   . Hypertension   . Glaucoma (increased eye pressure) 2011  . Microcytic anemia 04/25/2013  . 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 He has never used smokeless tobacco. He reports that he does not drink alcohol or use illicit drugs.  Allergies: No Known Allergies  Medications Prior to Admission  Medication Sig Dispense Refill  . amLODipine (NORVASC) 5 MG tablet Take 5 mg by mouth daily.      . furosemide (LASIX) 20 MG tablet Take 20 mg by mouth 2 (two) times daily.      Marland Kitchen glyBURIDE (DIABETA) 5 MG tablet Take 5 mg by mouth daily with  breakfast.      . insulin glargine (LANTUS) 100 UNIT/ML injection Inject 30 Units into the skin at bedtime.       Marland Kitchen levofloxacin (LEVAQUIN) 500 MG tablet Take 500 mg by mouth daily.      Marland Kitchen linagliptin (TRADJENTA) 5 MG TABS tablet Take 5 mg by mouth daily.      Marland Kitchen lisinopril (PRINIVIL,ZESTRIL) 20 MG tablet Take 20 mg by mouth daily.      . brimonidine (ALPHAGAN) 0.2 % ophthalmic solution Place 1 drop into the left eye 3 (three) times daily.           JYN:WGNFA from the symptoms mentioned above,there are no other symptoms referable to all systems reviewed.  Physical Exam: Blood pressure 139/67, pulse 93, temperature 98 F (36.7 C), temperature source Oral, resp. rate 25, height 6\' 1"  (1.854 m), weight 135.1 kg (297 lb 13.5 oz), SpO2 94.00%. Gen: awake in ICU bed, no distress Heent: well hydrated Neck: No jVD, no goiter. Abdomen: obese, non tender Ext: dressed ulcers on bilateral feet. Discolored on both feet. Resp: CLAB CVS: s1 and s2 well heard, no murmur. Skin: discoloration of feet, poor pulses on PT, and DP arteries.    Recent Labs  09/08/13 1258 09/09/13 0600  WBC 22.7* 19.7*  NEUTROABS 19.9*  --   HGB 5.4* 6.7*  HCT 16.7* 19.7*  MCV 68.7* 71.4*  PLT 415* 382  Recent Labs  09/08/13 1410 09/09/13 0503  NA 117* 125*  K 4.3 3.6  CL 81* 90*  CO2 19 20  GLUCOSE 572* 153*  BUN 78* 74*  CREATININE 7.47* 7.03*  CALCIUM 7.9* 8.1*  PHOS  --  5.4*    Recent Labs  09/08/13 1410  AST 43*  ALT 43  ALKPHOS 363*  BILITOT 1.2  PROT 6.8  ALBUMIN 1.6*   Recent Results (from the past 240 hour(s))  MRSA PCR SCREENING     Status: None   Collection Time    09/08/13  5:29 PM      Result Value Range Status   MRSA by PCR NEGATIVE  NEGATIVE Final   Comment:            The GeneXpert MRSA Assay (FDA     approved for NASAL specimens     only), is one component of a     comprehensive MRSA colonization     surveillance program. It is not     intended to diagnose MRSA      infection nor to guide or     monitor treatment for     MRSA infections.    Dg Foot Complete Left  09/08/2013   CLINICAL DATA:  Diabetes.  Swelling.  Possible osteomyelitis.  EXAM: LEFT FOOT - COMPLETE 3+ VIEW  COMPARISON:  MRI foot 06/17/2013.  FINDINGS: Extensive soft tissue swelling is noted about the left foot particularly over the left 5th digit and lateral aspect of the foot. Subcutaneous air is noted adjacent to the left 5th metatarsal. Destructive changes are noted of the proximal and distal portions of the 5th metatarsal and proximal left 5th phalanx. These findings are consistent with persistent or recurrent osteomyelitis of the left 5th digit with adjacent cellulitis and possible soft tissue abscess. The osteomyelitis most likely has a chronic component as callus formation is present.  IMPRESSION: Extensive changes suggesting osteomyelitis of the left 5th metatarsal and proximal left 5th phalanx. Severe adjacent soft tissue swelling and soft tissue air is noted suggesting adjacent cellulitis and possible soft tissue abscess. These results will be called to the ordering clinician or representative by the Radiologist Assistant, and communication documented in the PACS Dashboard.   Electronically Signed   By: Maisie Fus  Register   On: 09/08/2013 14:45   Dg Foot Complete Right  09/08/2013   CLINICAL DATA:  Right foot pain and swelling.  History of diabetes.  EXAM: RIGHT FOOT COMPLETE - 3+ VIEW  COMPARISON:  None  FINDINGS: There is no evidence of acute fracture, subluxation or dislocation.  No radiographic evidence of osteomyelitis is noted.  The joint spaces are within normal limits.  No focal bony abnormalities are present.  The Lisfranc joints are unremarkable.  Dorsal soft tissue swelling is present.  IMPRESSION: Soft tissue swelling without bony abnormality.   Electronically Signed   By: Laveda Abbe M.D.   On: 09/08/2013 14:40   Impression: 1) Chronically uncontrolled complicated type 2 DM  with CKD, retinopathy, PAD 2) DKA -resolved 3) Non compliance to medications and follow ups 4) Hypertension 5) Obesity 6) Diabetic Feet Ulcers  Active Problems:   * No active hospital problems. *  Plan:  Patient has currently uncontrolled symptomatic type 2 DM  S/p DKA which has resolved after 13 hours of IV insulin. Acutely, purpose is to control glycemia and avoid rebound hyperglycemia. Continue IV insulin for a total of 24 hours , and will be evaluated for conversion to  basal/bolus  insulin. I will obtain a repeat a1c, last one in Epic is from July. He is being worked up for osteomyelitis , if confirmed he will need tight glycemic control to facilitate teh healing process.  In the long term, he is a patient with multiple complication and non compliance. i have briefly counseled him to engage in intensive therapy with insulin and offered him out patient f/u a week after discharge.   he remains at a very high risk for more acute and chronic complications which include CAD, CVA, ESRD, retinopathy, and neuropathy. These are all discussed in detail with the patient.  I have counseled the patient on diet management and weight loss, by adopting a carbohydrate restricted diet. -his oral medications will be held while he is in hospital, and will be re-visted as out patient for them. -I will continue to f/u.  Jovani Flury 09/09/2013, 8:33 AM

## 2013-09-09 NOTE — Consult Note (Signed)
Podiatry Consult Note  Reason for Consultation:  Ulcerations of both feet.  History of Present Illness: Donald Sampson is a 37 y.o. male was admitted on 09/08/2013 after presenting to the emergency department with hyperglycemia.  He developed bilateral foot ulcerations this summer.  He has been followed at the wound care center in [place].  He has been receiving home health.  Initially, the wounds were packed with Mesalt.  Currently he has been using Iodosorb on the ulcerations.  He dates improvement in the overall appearance in the ulcerations from his initial presentation.  He was diagnosed with diabetes about 8-9 years ago.    Past Medical History  Diagnosis Date  . Diabetes mellitus   . Hypertension   . Glaucoma (increased eye pressure) 2011  . Microcytic anemia 04/25/2013  . 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 amLODipine  5 mg Oral Daily  . brimonidine  1 drop Left Eye TID  . furosemide  100 mg Intravenous BID  . heparin subcutaneous  5,000 Units Subcutaneous Q8H  . insulin regular  0-10 Units Intravenous TID AC  . piperacillin-tazobactam (ZOSYN)  IV  2.25 g Intravenous Q6H  . [START ON 09/10/2013] vancomycin  1,500 mg Intravenous Q48H   Continuous Infusions: . dextrose 5 % and 0.9% NaCl 125 mL/hr at 09/09/13 1600  . insulin (NOVOLIN-R) infusion 18 Units/hr (09/09/13 1506)   PRN Meds:.  No Known Allergies Past Surgical History  Procedure Laterality Date  . Refractive surgery     No family history on file.   Social History:  reports that he has never smoked. He has never used smokeless tobacco. He reports that he does not drink alcohol or use illicit drugs.  Review of Systems: Fatigue, ulcerations of both feet and vision loss.  Physical Examination: Vital signs in last 24 hours:   Temp:  [97.5 F (36.4 C)-99 F (37.2 C)] 98.7 F (37.1 C) (11/14 1637) Pulse Rate:  [90-100] 94 (11/14 1615) Resp:  [7-33] 30 (11/14 1615) BP: (115-150)/(50-77) 129/70  mmHg (11/14 1615) SpO2:  [93 %-100 %] 100 % (11/14 1615) Weight:  [135.1 kg (297 lb 13.5 oz)] 135.1 kg (297 lb 13.5 oz) (11/14 0400)  Dressings are present on both feet.  There is strikethrough on the lateral aspect of the dressing of the left foot.  Both dressings were removed.  Skin of both feet is warm.  The nails of digits 1, 2, 3 and 5 of the left foot are elongated thickened dystrophic with subungual debris present.  The nails of digits 1-5 of the right foot are thickened dystrophic with subungual debris present.  Digital hair growth is absent bilaterally.  There is an area of hypopigmentation on the dorsal medial aspect of the first metatarsal head region of the left foot.  This is consistent with a scar from a healed wound.  There is a full-thickness ulceration along the plantar lateral aspect of the fifth metatarsal head of the left foot.  The wound bed is granular with significant surrounding hyperkeratotic tissue present.  The ulceration measures 2.2 cm in length by 3.4 cm in width by 0.2 cm in depth.  There is a full-thickness ulceration present along the dorsal medial aspect of the right foot just proximal to the first metatarsal head.  This ulceration measures 1.9 cm in length by 2.4 cm in width by 0.2 cm in depth.  The wound bed is granular.  There is hyperpigmentation noted along the plantar aspect of the first metatarsal head  of the right foot.  This is consistent with scarring from a healed wound.  There is a full-thickness ulceration present along the plantar aspect of the fifth metatarsal head of the right foot.  The ulceration measures 1.2 cm in length by 3.8 cm in width by 0.2 cm in depth.  There is surrounding hyperkeratotic tissue present.  The wound bed is granular.  There is significant edema of the left foot and ankle.  I was unable to palpate a dorsalis pedis pulse of the left foot.  I was able to palpate a posterior tibial pulse of the left foot.  There is mild to moderate edema of  the right foot.  I was able to palpate both the dorsalis pedis and posterior tibial pulse of the right foot.  Protective sensation is absent.  Lab/Test Results:   Recent Labs  09/08/13 1258 09/08/13 1410 09/09/13 0503 09/09/13 0600  WBC 22.7*  --   --  19.7*  HGB 5.4*  --   --  6.7*  HCT 16.7*  --   --  19.7*  PLT 415*  --   --  382  NA 116* 117* 125*  --   K 4.1 4.3 3.6  --   CL 80* 81* 90*  --   CO2 18* 19 20  --   BUN 77* 78* 74*  --   CREATININE 7.25* 7.47* 7.03*  --   GLUCOSE 572* 572* 153*  --   CALCIUM 8.1* 7.9* 8.1*  --     Recent Results (from the past 240 hour(s))  MRSA PCR SCREENING     Status: None   Collection Time    09/08/13  5:29 PM      Result Value Range Status   MRSA by PCR NEGATIVE  NEGATIVE Final   Comment:            The GeneXpert MRSA Assay (FDA     approved for NASAL specimens     only), is one component of a     comprehensive MRSA colonization     surveillance program. It is not     intended to diagnose MRSA     infection nor to guide or     monitor treatment for     MRSA infections.     Dg Foot Complete Left  09/08/2013   CLINICAL DATA:  Diabetes.  Swelling.  Possible osteomyelitis.  EXAM: LEFT FOOT - COMPLETE 3+ VIEW  COMPARISON:  MRI foot 06/17/2013.  FINDINGS: Extensive soft tissue swelling is noted about the left foot particularly over the left 5th digit and lateral aspect of the foot. Subcutaneous air is noted adjacent to the left 5th metatarsal. Destructive changes are noted of the proximal and distal portions of the 5th metatarsal and proximal left 5th phalanx. These findings are consistent with persistent or recurrent osteomyelitis of the left 5th digit with adjacent cellulitis and possible soft tissue abscess. The osteomyelitis most likely has a chronic component as callus formation is present.  IMPRESSION: Extensive changes suggesting osteomyelitis of the left 5th metatarsal and proximal left 5th phalanx. Severe adjacent soft tissue  swelling and soft tissue air is noted suggesting adjacent cellulitis and possible soft tissue abscess. These results will be called to the ordering clinician or representative by the Radiologist Assistant, and communication documented in the PACS Dashboard.   Electronically Signed   By: Maisie Fus  Register   On: 09/08/2013 14:45   Dg Foot Complete Right  09/08/2013   CLINICAL DATA:  Right foot pain and swelling.  History of diabetes.  EXAM: RIGHT FOOT COMPLETE - 3+ VIEW  COMPARISON:  None  FINDINGS: There is no evidence of acute fracture, subluxation or dislocation.  No radiographic evidence of osteomyelitis is noted.  The joint spaces are within normal limits.  No focal bony abnormalities are present.  The Lisfranc joints are unremarkable.  Dorsal soft tissue swelling is present.  IMPRESSION: Soft tissue swelling without bony abnormality.   Electronically Signed   By: Laveda Abbe M.D.   On: 09/08/2013 14:40    Assessment: 1.  Osteomyelitis of the fifth ray, left foot with concern for involvement of the base of the fourth metatarsal bone and cuboid. 2.  Ulcerations of both feet. 3.  Diabetes mellitus with peripheral neuropathy. 4.  Onychomycosis.  Plan: The podiatric pathology and treatment options were reviewed with him.  I explained to him the radiographic findings involving his left foot.  I explained to him that these findings are consistent with a bone infection.  I explained to him that this would require resection / amputation of the infected bone.  He understands this and would like to follow-up with the general surgeon at the wound care center in [place].  In this specific case, a fifth ray amputation is indicated.  Given the patient's body habitus I am concerned with the success of this type of amputation due to the location of the peroneus brevis tendon.  I do recommend proceeding with MRI to determine the extent of the infection present.  It is highly likely that there is involvement of the cuboid  as well.  Therefore, he is at high risk for a proximal amputation.  A dressing was applied reapplied to both feet with the assistance of the nursing staff.  This is to be changed daily throughout the weekend.  I agree with the choice of antibiotics.  He will require IV antibiotic therapy for at least 6 weeks following discharge.  I will review his MRI Monday.  I will discuss the radiographic findings and the findings of the MRI with the surgeon at [facility].   Moritz Lever IVAN 09/09/2013, 5:56 PM

## 2013-09-10 LAB — BASIC METABOLIC PANEL
BUN: 62 mg/dL — ABNORMAL HIGH (ref 6–23)
CO2: 21 mEq/L (ref 19–32)
Calcium: 8.2 mg/dL — ABNORMAL LOW (ref 8.4–10.5)
Creatinine, Ser: 5.42 mg/dL — ABNORMAL HIGH (ref 0.50–1.35)
GFR calc Af Amer: 14 mL/min — ABNORMAL LOW (ref >90)
Glucose, Bld: 98 mg/dL (ref 70–99)
Sodium: 128 mEq/L — ABNORMAL LOW (ref 135–145)

## 2013-09-10 LAB — GLUCOSE, CAPILLARY
Glucose-Capillary: 132 mg/dL — ABNORMAL HIGH (ref 70–99)
Glucose-Capillary: 181 mg/dL — ABNORMAL HIGH (ref 70–99)
Glucose-Capillary: 208 mg/dL — ABNORMAL HIGH (ref 70–99)
Glucose-Capillary: 249 mg/dL — ABNORMAL HIGH (ref 70–99)
Glucose-Capillary: 92 mg/dL (ref 70–99)
Glucose-Capillary: 93 mg/dL (ref 70–99)
Glucose-Capillary: 98 mg/dL (ref 70–99)
Glucose-Capillary: 99 mg/dL (ref 70–99)
Glucose-Capillary: 99 mg/dL (ref 70–99)

## 2013-09-10 LAB — CBC
HCT: 23.8 % — ABNORMAL LOW (ref 39.0–52.0)
Hemoglobin: 8.3 g/dL — ABNORMAL LOW (ref 13.0–17.0)
MCH: 25.2 pg — ABNORMAL LOW (ref 26.0–34.0)
MCHC: 34.9 g/dL (ref 30.0–36.0)
MCV: 72.1 fL — ABNORMAL LOW (ref 78.0–100.0)
Platelets: 436 10*3/uL — ABNORMAL HIGH (ref 150–400)
RBC: 3.3 MIL/uL — ABNORMAL LOW (ref 4.22–5.81)
RDW: 16.7 % — ABNORMAL HIGH (ref 11.5–15.5)
WBC: 17.2 10*3/uL — ABNORMAL HIGH (ref 4.0–10.5)

## 2013-09-10 LAB — TYPE AND SCREEN
Unit division: 0
Unit division: 0
Unit division: 0

## 2013-09-10 LAB — VITAMIN D 25 HYDROXY (VIT D DEFICIENCY, FRACTURES): Vit D, 25-Hydroxy: 15 ng/mL — ABNORMAL LOW (ref 30–89)

## 2013-09-10 MED ORDER — EPOETIN ALFA 10000 UNIT/ML IJ SOLN
10000.0000 [IU] | INTRAMUSCULAR | Status: DC
  Administered 2013-09-10: 10000 [IU] via SUBCUTANEOUS
  Filled 2013-09-10: qty 1

## 2013-09-10 MED ORDER — INSULIN ASPART 100 UNIT/ML ~~LOC~~ SOLN
0.0000 [IU] | SUBCUTANEOUS | Status: DC
  Administered 2013-09-10: 2 [IU] via SUBCUTANEOUS
  Administered 2013-09-10 (×2): 4 [IU] via SUBCUTANEOUS
  Administered 2013-09-11: 5 [IU] via SUBCUTANEOUS
  Administered 2013-09-11 (×2): 3 [IU] via SUBCUTANEOUS
  Administered 2013-09-11: 2 [IU] via SUBCUTANEOUS
  Administered 2013-09-11: 5 [IU] via SUBCUTANEOUS
  Administered 2013-09-12 (×2): 6 [IU] via SUBCUTANEOUS

## 2013-09-10 MED ORDER — SODIUM CHLORIDE 0.9 % IV SOLN
INTRAVENOUS | Status: DC
  Administered 2013-09-10 – 2013-09-12 (×4): via INTRAVENOUS

## 2013-09-10 MED ORDER — CALCIUM ACETATE 667 MG PO CAPS
667.0000 mg | ORAL_CAPSULE | Freq: Three times a day (TID) | ORAL | Status: DC
  Administered 2013-09-10 – 2013-09-12 (×7): 667 mg via ORAL
  Filled 2013-09-10 (×7): qty 1

## 2013-09-10 MED ORDER — INSULIN REGULAR BOLUS VIA INFUSION
0.0000 [IU] | Freq: Three times a day (TID) | INTRAVENOUS | Status: AC
  Filled 2013-09-10: qty 10

## 2013-09-10 MED ORDER — INSULIN GLARGINE 100 UNIT/ML ~~LOC~~ SOLN
50.0000 [IU] | Freq: Every day | SUBCUTANEOUS | Status: DC
  Administered 2013-09-10 – 2013-09-11 (×2): 50 [IU] via SUBCUTANEOUS
  Filled 2013-09-10 (×3): qty 0.5

## 2013-09-10 MED ORDER — SODIUM CHLORIDE 0.9 % IV SOLN: INTRAVENOUS | Status: AC

## 2013-09-10 MED ORDER — INSULIN ASPART 100 UNIT/ML ~~LOC~~ SOLN
10.0000 [IU] | Freq: Three times a day (TID) | SUBCUTANEOUS | Status: DC
  Administered 2013-09-10 – 2013-09-11 (×5): 10 [IU] via SUBCUTANEOUS

## 2013-09-10 NOTE — Progress Notes (Signed)
Donald Sampson  MRN: [id number]  DOB/AGE: 1976-02-09 37 y.o.  Primary Care Physician:FANTA,TESFAYE, MD  Admit date: 09/08/2013  Chief Complaint:  Chief Complaint  Patient presents with  . Hyperglycemia    S-Pt presented on  09/08/2013 with  Chief Complaint  Patient presents with  . Hyperglycemia  .    Pt feels better. Pt offers no new complaints.  Pt says I want to go home.    Meds . amLODipine  5 mg Oral Daily  . brimonidine  1 drop Left Eye TID  . furosemide  100 mg Intravenous BID  . heparin subcutaneous  5,000 Units Subcutaneous Q8H  . insulin aspart  0-24 Units Subcutaneous Q4H  . insulin aspart  10 Units Subcutaneous TID WC  . insulin glargine  50 Units Subcutaneous Daily  . insulin regular  0-10 Units Intravenous TID AC  . piperacillin-tazobactam (ZOSYN)  IV  2.25 g Intravenous Q6H  . vancomycin  1,500 mg Intravenous Q48H      Physical Exam: Vital signs in last 24 hours: Temp:  [97.5 F (36.4 C)-100.1 F (37.8 C)] 99.3 F (37.4 C) (11/15 0801) Pulse Rate:  [90-106] 106 (11/15 0100) Resp:  [14-33] 26 (11/15 0100) BP: (126-171)/(50-79) 148/73 mmHg (11/15 0200) SpO2:  [91 %-100 %] 99 % (11/15 0100) Weight:  [292 lb 15.9 oz (132.9 kg)] 292 lb 15.9 oz (132.9 kg) (11/15 0500) Weight change: 2 lb 15.9 oz (1.357 kg) Last BM Date: 09/08/13  Intake/Output from previous day: 11/14 0701 - 11/15 0700 In: 4551.1 [P.O.:600; I.V.:3107.3; Blood:573.8; IV Piggyback:270] Out: 5350 [Urine:5350]     Physical Exam: General- pt is awake,alert, oriented to time place and person Resp- No acute REsp distress, CTA B/L NO Rhonchi CVS- S1S2 regular in rate and rhythm GIT- BS+, soft, NT, ND EXT- trace LE Edema, B/l feet in bandages   Lab Results: CBC  Recent Labs  09/08/13 1258 09/09/13 0600  WBC 22.7* 19.7*  HGB 5.4* 6.7*  HCT 16.7* 19.7*  PLT 415* 382    BMET  Recent Labs  09/09/13 0503 09/10/13 0555  NA 125* 128*  K 3.6 3.3*  CL 90* 95*  CO2 20  21  GLUCOSE 153* 98  BUN 74* 62*  CREATININE 7.03* 5.42*  CALCIUM 8.1* 8.2*   Creat trend 2014  7.25==>5.42             2.8--3.4  2013     3.28  Sodium 2014   116==>125==>125  MICRO Recent Results (from the past 240 hour(s))  MRSA PCR SCREENING     Status: None   Collection Time    09/08/13  5:29 PM      Result Value Range Status   MRSA by PCR NEGATIVE  NEGATIVE Final   Comment:            The GeneXpert MRSA Assay (FDA     approved for NASAL specimens     only), is one component of a     comprehensive MRSA colonization     surveillance program. It is not     intended to diagnose MRSA     infection nor to guide or     monitor treatment for     MRSA infections.      Lab Results  Component Value Date   PTH 60.7 11/07/2012   CALCIUM 8.2* 09/10/2013   PHOS 5.4* 09/09/2013    Results for Donald Sampson, Donald Sampson (MRN [id number]) as of 09/10/2013 07:20  Ref. Range 09/08/2013 15:41 09/09/2013  05:03  Iron Latest Range: 42-135 ug/dL <40 (L)   UIBC Latest Range: 125-400 ug/dL 981   TIBC Latest Range: 215-435 ug/dL Not calculated due to Iron <10.   Saturation Ratios Latest Range: 20-55 % Not calculated due to Iron <10.   Ferritin Latest Range: 22-322 ng/mL  1176 (H)  Folate No range found 4.9        Impression: 1)Renal  AKI secondary to ATN/Prerenal                AKI on CKD                AKI improving               CKD stage 4.               CKD since 2013 ( Most likley before that)               CKD secondary to DM                Progression of CKD marked with AKI                Proteinura  Present .                Strong family hx of ESRD               2)HTN Target Organ damage  CKD PVD  Medication- On Diuretics On Calcium Channel Blockers    3)Anemia HGb NOT at goal (9--11) Iron deficiency and Chronic disease Chronic disease( Osteo + CKD) recived PRBC  4)CKD Mineral-Bone Disorder PTH not avail . Secondary Hyperparathyroidism w/u  pending. Phosphorus not at goal.   5)ID- Osteomyelitis On ABX  6)Electrolytes  Hypokalemic   Sec to Diuresis ( osmotic from hyperglycemia and now lasix)  Hyponatremic    Sec to Hyperglycemia + CKD    Improving   7)Acid base Co2 at goal     Plan:   Will start epo Will folow bmet Will start Binders Will ask for cbc  Educated pt about decline in GFr and need of access placement as outpt.      BHUTANI,MANPREET S 09/10/2013, 8:05 AM

## 2013-09-10 NOTE — Progress Notes (Signed)
Subjective: Patient is resting. Claims he is doing fine. Very apathetic and uncooperative.  Objective: Vital signs in last 24 hours: Temp:  [97.5 F (36.4 C)-100.1 F (37.8 C)] 99.3 F (37.4 C) (11/15 0801) Pulse Rate:  [90-106] 106 (11/15 0100) Resp:  [14-33] 26 (11/15 0100) BP: (126-171)/(50-79) 148/73 mmHg (11/15 0200) SpO2:  [91 %-100 %] 99 % (11/15 0100) Weight:  [132.9 kg (292 lb 15.9 oz)] 132.9 kg (292 lb 15.9 oz) (11/15 0500) Weight change: 1.357 kg (2 lb 15.9 oz) Last BM Date: 09/08/13  Intake/Output from previous day: 11/14 0701 - 11/15 0700 In: 4683.6 [P.O.:600; I.V.:3239.8; Blood:573.8; IV Piggyback:270] Out: 5350 [Urine:5350]  PHYSICAL EXAM General appearance: no distress and uncooperative Resp: clear to auscultation bilaterally Cardio: S1, S2 normal GI: soft, non-tender; bowel sounds normal; no masses,  no organomegaly Extremities: dressed bilateral foot ulcer  Lab Results:    @labtest @ [facility] No results found for this basename: PHART, PCO2, PO2ART, TCO2, HCO3,  in the last 72 hours CULTURES Recent Results (from the past 240 hour(s))  MRSA PCR SCREENING     Status: None   Collection Time    09/08/13  5:29 PM      Result Value Range Status   MRSA by PCR NEGATIVE  NEGATIVE Final   Comment:            The GeneXpert MRSA Assay (FDA     approved for NASAL specimens     only), is one component of a     comprehensive MRSA colonization     surveillance program. It is not     intended to diagnose MRSA     infection nor to guide or     monitor treatment for     MRSA infections.   Studies/Results: Dg Foot Complete Left  09/08/2013   CLINICAL DATA:  Diabetes.  Swelling.  Possible osteomyelitis.  EXAM: LEFT FOOT - COMPLETE 3+ VIEW  COMPARISON:  MRI foot 06/17/2013.  FINDINGS: Extensive soft tissue swelling is noted about the left foot particularly over the left 5th digit and lateral aspect of the foot. Subcutaneous air is noted adjacent to the left 5th  metatarsal. Destructive changes are noted of the proximal and distal portions of the 5th metatarsal and proximal left 5th phalanx. These findings are consistent with persistent or recurrent osteomyelitis of the left 5th digit with adjacent cellulitis and possible soft tissue abscess. The osteomyelitis most likely has a chronic component as callus formation is present.  IMPRESSION: Extensive changes suggesting osteomyelitis of the left 5th metatarsal and proximal left 5th phalanx. Severe adjacent soft tissue swelling and soft tissue air is noted suggesting adjacent cellulitis and possible soft tissue abscess. These results will be called to the ordering clinician or representative by the Radiologist Assistant, and communication documented in the PACS Dashboard.   Electronically Signed   By: Maisie Fus  Register   On: 09/08/2013 14:45   Dg Foot Complete Right  09/08/2013   CLINICAL DATA:  Right foot pain and swelling.  History of diabetes.  EXAM: RIGHT FOOT COMPLETE - 3+ VIEW  COMPARISON:  None  FINDINGS: There is no evidence of acute fracture, subluxation or dislocation.  No radiographic evidence of osteomyelitis is noted.  The joint spaces are within normal limits.  No focal bony abnormalities are present.  The Lisfranc joints are unremarkable.  Dorsal soft tissue swelling is present.  IMPRESSION: Soft tissue swelling without bony abnormality.   Electronically Signed   By: Laveda Abbe M.D.   On:  09/08/2013 14:40    Medications: I have reviewed the patient's current medications.  Assesment: 1.Diabetic Ketoacidosis  2. Hyponatremia  3. Acute on chronic renal failure  4. Hypertension  5. Bilateral foot ulcer  6. Osteomyelitis rt foot  7. Legally Blind  8. Anemia  9. Medical noncomplaince  Active Problems:   * No active hospital problems. *    Plan: Medications reviewed Continue as per nephrology and endocrinologist recommendation Wound consult Continue iv antibiotics Will monitor cbc/ BMP    LOS: 2 days   Donald Sampson 09/10/2013, 8:54 AM

## 2013-09-10 NOTE — Progress Notes (Signed)
Went into patients room to assess him. Patient had removed all his telemetry wires and BP cuff stating he did not want to be hooked up to it all and Dr. Felecia Shelling did not tell him he would have to be when he was admitted. Explained to the patient that he was an ICU patient and was getting the highest level of care and that he needed to be monitored continuously. Patient laid in bed with flat affect and did not respond but did allow staff to replace monitors.

## 2013-09-11 LAB — BASIC METABOLIC PANEL
BUN: 55 mg/dL — ABNORMAL HIGH (ref 6–23)
CO2: 23 mEq/L (ref 19–32)
Calcium: 8.6 mg/dL (ref 8.4–10.5)
Chloride: 93 mEq/L — ABNORMAL LOW (ref 96–112)
Creatinine, Ser: 4.55 mg/dL — ABNORMAL HIGH (ref 0.50–1.35)
GFR calc Af Amer: 18 mL/min — ABNORMAL LOW (ref >90)
GFR calc non Af Amer: 15 mL/min — ABNORMAL LOW (ref >90)
Glucose, Bld: 192 mg/dL — ABNORMAL HIGH (ref 70–99)
Potassium: 3.9 mEq/L (ref 3.5–5.1)
Sodium: 129 mEq/L — ABNORMAL LOW (ref 135–145)

## 2013-09-11 LAB — GLUCOSE, CAPILLARY
Glucose-Capillary: 164 mg/dL — ABNORMAL HIGH (ref 70–99)
Glucose-Capillary: 181 mg/dL — ABNORMAL HIGH (ref 70–99)
Glucose-Capillary: 269 mg/dL — ABNORMAL HIGH (ref 70–99)

## 2013-09-11 LAB — CBC
HCT: 24.9 % — ABNORMAL LOW (ref 39.0–52.0)
Hemoglobin: 8.6 g/dL — ABNORMAL LOW (ref 13.0–17.0)
MCH: 25.1 pg — ABNORMAL LOW (ref 26.0–34.0)
MCHC: 34.5 g/dL (ref 30.0–36.0)
MCV: 72.6 fL — ABNORMAL LOW (ref 78.0–100.0)
Platelets: 480 10*3/uL — ABNORMAL HIGH (ref 150–400)
RBC: 3.43 MIL/uL — ABNORMAL LOW (ref 4.22–5.81)
RDW: 17 % — ABNORMAL HIGH (ref 11.5–15.5)
WBC: 17.8 10*3/uL — ABNORMAL HIGH (ref 4.0–10.5)

## 2013-09-11 MED ORDER — ACETAMINOPHEN 325 MG PO TABS
650.0000 mg | ORAL_TABLET | Freq: Four times a day (QID) | ORAL | Status: DC | PRN
  Administered 2013-09-11 – 2013-09-12 (×2): 650 mg via ORAL
  Filled 2013-09-11 (×2): qty 2

## 2013-09-11 NOTE — Progress Notes (Signed)
Subjective: Patient spiked fever last night. Blood culture was done. He is receiving iv antibiotics.  Objective: Vital signs in last 24 hours: Temp:  [97.7 F (36.5 C)-101 F (38.3 C)] 97.7 F (36.5 C) (11/16 0820) Resp:  [8-30] 20 (11/16 0600) BP: (118-171)/(53-90) 146/84 mmHg (11/16 0600) Weight change:  Last BM Date: 09/08/13  Intake/Output from previous day: 11/15 0701 - 11/16 0700 In: 4072.5 [P.O.:240; I.V.:3012.5; IV Piggyback:820] Out: 6900 [Urine:6900]  PHYSICAL EXAM General appearance: no distress and uncooperative Resp: clear to auscultation bilaterally Cardio: S1, S2 normal GI: soft, non-tender; bowel sounds normal; no masses,  no organomegaly Extremities: dressed bilateral foot ulcer  Lab Results:    @labtest @ ABGS No results found for this basename: PHART, PCO2, PO2ART, TCO2, HCO3,  in the last 72 hours CULTURES Recent Results (from the past 240 hour(s))  MRSA PCR SCREENING     Status: None   Collection Time    09/08/13  5:29 PM      Result Value Range Status   MRSA by PCR NEGATIVE  NEGATIVE Final   Comment:            The GeneXpert MRSA Assay (FDA     approved for NASAL specimens     only), is one component of a     comprehensive MRSA colonization     surveillance program. It is not     intended to diagnose MRSA     infection nor to guide or     monitor treatment for     MRSA infections.  CULTURE, BLOOD (ROUTINE X 2)     Status: None   Collection Time    09/11/13  1:24 AM      Result Value Range Status   Specimen Description BLOOD LEFT ANTECUBITAL   Final   Special Requests BOTTLES DRAWN AEROBIC AND ANAEROBIC 8CC   Final   Culture NO GROWTH <24 HRS   Final   Report Status PENDING   Incomplete  CULTURE, BLOOD (ROUTINE X 2)     Status: None   Collection Time    09/11/13  1:29 AM      Result Value Range Status   Specimen Description BLOOD BLOOD LEFT HAND   Final   Special Requests BOTTLES DRAWN AEROBIC AND ANAEROBIC 9CC   Final   Culture NO  GROWTH <24 HRS   Final   Report Status PENDING   Incomplete   Studies/Results: No results found.  Medications: I have reviewed the patient's current medications.  Assesment: 1.Diabetic Ketoacidosis  2. Hyponatremia  3. Acute on chronic renal failure  4. Hypertension  5. Bilateral foot ulcer  6. Osteomyelitis rt foot  7. Legally Blind  8. Anemia  9. Medical noncomplaince  Active Problems:   * No active hospital problems. *    Plan: Medications reviewed Wound care Continue iv antibiotics Will monitor cbc/ BMP   LOS: 3 days   Nadalee Neiswender 09/11/2013, 9:40 AM

## 2013-09-11 NOTE — Progress Notes (Signed)
Report called to [facility]. Patient transferred to 319 in stable condition via wheelchair.

## 2013-09-11 NOTE — Progress Notes (Signed)
Subjective: Interval History: Patient offers no complaints. He denies any difficulty in breathing. Patient also does not have any nausea or vomiting and appetite is good. Objective: Vital signs in last 24 hours: Temp:  [98.5 F (36.9 C)-101 F (38.3 C)] 98.5 F (36.9 C) (11/16 0400) Resp:  [8-30] 20 (11/16 0600) BP: (118-171)/(53-90) 146/84 mmHg (11/16 0600) Weight change:   Intake/Output from previous day: 11/15 0701 - 11/16 0700 In: 3947.5 [P.O.:240; I.V.:2887.5; IV Piggyback:820] Out: 6900 [Urine:6900] Intake/Output this shift:    General appearance: alert, cooperative and no distress Resp: clear to auscultation bilaterally Cardio: regular rate and rhythm, S1, S2 normal, no murmur, click, rub or gallop GI: soft, non-tender; bowel sounds normal; no masses,  no organomegaly Extremities: Patient with trace edema. He has also a lateral foot ulcer  Lab Results:  Recent Labs  09/10/13 0555 09/11/13 0129  WBC 17.2* 17.8*  HGB 8.3* 8.6*  HCT 23.8* 24.9*  PLT 436* 480*   BMET:   Recent Labs  09/10/13 0555 09/11/13 0129  NA 128* 129*  K 3.3* 3.9  CL 95* 93*  CO2 21 23  GLUCOSE 98 192*  BUN 62* 55*  CREATININE 5.42* 4.55*  CALCIUM 8.2* 8.6   No results found for this basename: PTH,  in the last 72 hours Iron Studies:   Recent Labs  09/08/13 1541 09/09/13 0503  IRON <10*  --   TIBC Not calculated due to Iron <10.  --   FERRITIN  --  1176*    Studies/Results: No results found.  I have reviewed the patient's current medications.  Assessment/Plan: Problem #1 acute kidney injury superimposed on chronic his BUN and creatinine is 55 and 4.5 respectively renal function seems to be improving. Still his creatinine is over his baseline. Presently patient is none oliguric and he is on Lasix. Problem #2 chronic renal failure stage IV. Presently patient doesn't have any uremic sinus symptoms. This was thought to be secondary to diabetes/hypertension. Problem #3  hypertension his blood pressure is reasonably controlled Problem #4 diabetes. Has poorly controlled. His blood glucose is better today. Problem #5 hyponatremia sodium has improved. Today it's 129. 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 His hemoglobin and hematocrit remains low. But he is stable. Problem #7 history of proteinuria. Problem #8 cellulitis of his his feet. Presently is on antibiotics. Patient her febrile but still with leukocytosis.  Problem #9 metabolic bone disease his calcium is was in acceptable range phosphorus slightly high. Presently controlled with diet. Problem #10 hypokalemia his potassium is corrected. Plan: Continue his normal saline at 125 cc per hour           Will DC Lasix.           We'll check his basic metabolic panel in the morning.               LOS: 3 days   Lynasia Meloche S 09/11/2013,8:11 AM

## 2013-09-12 ENCOUNTER — Encounter (HOSPITAL_COMMUNITY): Payer: Medicaid Other

## 2013-09-12 ENCOUNTER — Inpatient Hospital Stay (HOSPITAL_COMMUNITY): Payer: Medicaid Other

## 2013-09-12 LAB — CBC
HCT: 26.7 % — ABNORMAL LOW (ref 39.0–52.0)
Hemoglobin: 8.9 g/dL — ABNORMAL LOW (ref 13.0–17.0)
MCH: 24.7 pg — ABNORMAL LOW (ref 26.0–34.0)
Platelets: 517 10*3/uL — ABNORMAL HIGH (ref 150–400)
RBC: 3.61 MIL/uL — ABNORMAL LOW (ref 4.22–5.81)
RDW: 17.4 % — ABNORMAL HIGH (ref 11.5–15.5)
WBC: 18.9 10*3/uL — ABNORMAL HIGH (ref 4.0–10.5)

## 2013-09-12 LAB — BASIC METABOLIC PANEL
BUN: 50 mg/dL — ABNORMAL HIGH (ref 6–23)
CO2: 23 mEq/L (ref 19–32)
Chloride: 93 mEq/L — ABNORMAL LOW (ref 96–112)
GFR calc Af Amer: 21 mL/min — ABNORMAL LOW (ref >90)
Glucose, Bld: 234 mg/dL — ABNORMAL HIGH (ref 70–99)
Potassium: 4 mEq/L (ref 3.5–5.1)
Sodium: 128 mEq/L — ABNORMAL LOW (ref 135–145)

## 2013-09-12 LAB — GLUCOSE, CAPILLARY
Glucose-Capillary: 310 mg/dL — ABNORMAL HIGH (ref 70–99)
Glucose-Capillary: 306 mg/dL — ABNORMAL HIGH (ref 70–99)

## 2013-09-12 LAB — PTH, INTACT AND CALCIUM: Calcium, Total (PTH): 7.4 mg/dL — ABNORMAL LOW (ref 8.4–10.5)

## 2013-09-12 MED ORDER — VANCOMYCIN HCL 10 G IV SOLR: 1500.0000 mg | INTRAVENOUS | Status: AC

## 2013-09-12 MED ORDER — INSULIN GLARGINE 100 UNIT/ML ~~LOC~~ SOLN
65.0000 [IU] | Freq: Every day | SUBCUTANEOUS | Status: DC
  Administered 2013-09-12: 65 [IU] via SUBCUTANEOUS
  Filled 2013-09-12 (×2): qty 0.65

## 2013-09-12 MED ORDER — HEPARIN SOD (PORK) LOCK FLUSH 100 UNIT/ML IV SOLN: 250.0000 [IU] | Freq: Every day | INTRAVENOUS | Status: DC

## 2013-09-12 MED ORDER — VANCOMYCIN HCL 10 G IV SOLR: 1500.0000 mg | INTRAVENOUS | Status: DC

## 2013-09-12 MED ORDER — SODIUM CHLORIDE 0.9 % IJ SOLN
10.0000 mL | Freq: Two times a day (BID) | INTRAMUSCULAR | Status: DC
  Administered 2013-09-12: 10 mL

## 2013-09-12 MED ORDER — INSULIN ASPART 100 UNIT/ML ~~LOC~~ SOLN
15.0000 [IU] | Freq: Three times a day (TID) | SUBCUTANEOUS | Status: DC
  Administered 2013-09-12 (×2): 15 [IU] via SUBCUTANEOUS

## 2013-09-12 MED ORDER — SODIUM CHLORIDE 0.9 % IV SOLN
1500.0000 mg | INTRAVENOUS | Status: DC
  Administered 2013-09-12: 1500 mg via INTRAVENOUS
  Filled 2013-09-12 (×3): qty 1500

## 2013-09-12 MED ORDER — SODIUM CHLORIDE 0.9 % IJ SOLN: 10.0000 mL | INTRAMUSCULAR | Status: DC | PRN

## 2013-09-12 MED ORDER — INSULIN ASPART 100 UNIT/ML ~~LOC~~ SOLN: 15.0000 [IU] | Freq: Three times a day (TID) | SUBCUTANEOUS | Status: AC

## 2013-09-12 MED ORDER — HEPARIN SOD (PORK) LOCK FLUSH 100 UNIT/ML IV SOLN
250.0000 [IU] | INTRAVENOUS | Status: DC | PRN
  Administered 2013-09-12: 250 [IU]
  Filled 2013-09-12: qty 5

## 2013-09-12 NOTE — Progress Notes (Signed)
Subjective: Interval History: Patient offers no complaints. He denies any difficulty in breathing.  Objective: Vital signs in last 24 hours: Temp:  [98.2 F (36.8 C)-100.8 F (38.2 C)] 100.7 F (38.2 C) (11/17 0537) Pulse Rate:  [11-109] 11 (11/17 0537) Resp:  [20] 20 (11/17 0537) BP: (142-161)/(78-84) 155/84 mmHg (11/17 0537) SpO2:  [97 %-100 %] 100 % (11/17 0537) Weight change:   Intake/Output from previous day: 11/16 0701 - 11/17 0700 In: 775 [P.O.:600; I.V.:125; IV Piggyback:50] Out: 425 [Urine:425] Intake/Output this shift:    General appearance: alert, cooperative and no distress Resp: clear to auscultation bilaterally Cardio: regular rate and rhythm, S1, S2 normal, no murmur, click, rub or gallop GI: soft, non-tender; bowel sounds normal; no masses,  no organomegaly Extremities: Patient with trace edema. He has also a lateral foot ulcer  Lab Results:  Recent Labs  09/11/13 0129 09/12/13 0557  WBC 17.8* 18.9*  HGB 8.6* 8.9*  HCT 24.9* 26.7*  PLT 480* 517*   BMET:   Recent Labs  09/11/13 0129 09/12/13 0557  NA 129* 128*  K 3.9 4.0  CL 93* 93*  CO2 23 23  GLUCOSE 192* 234*  BUN 55* 50*  CREATININE 4.55* 3.91*  CALCIUM 8.6 8.5   No results found for this basename: PTH,  in the last 72 hours Iron Studies:  No results found for this basename: IRON, TIBC, TRANSFERRIN, FERRITIN,  in the last 72 hours  Studies/Results: No results found.  I have reviewed the patient's current medications.  Assessment/Plan: Problem #1 acute kidney injury superimposed on chronic his BUN and creatinine is 50 and 3.91 respectively renal function seems to be improving. Still his creatinine is over his baseline. Presently patient is none oliguric and he is off Lasix his urine out put has declined Problem #2 chronic renal failure stage IV. Presently patient doesn't have any uremic sinus symptoms. This was thought to be secondary to diabetes/hypertension. Problem #3 hypertension  his blood pressure is reasonably controlled Problem #4 diabetes. Has poorly controlled. His blood glucose is better today. Problem #5 hyponatremia sodium has improved but remain low. 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 His hemoglobin and hematocrit remains low. But he is stable. Problem #7 history of proteinuria. Problem #8 cellulitis of his his feet. Presently is on antibiotics. Patient her febrile but still with leukocytosis.  Problem #9 metabolic bone disease his calcium is was in acceptable range phosphorus slightly high. Presently controlled with diet. Problem #10 hypokalemia his potassium is corrected. Plan:  We will decrease his iv to 100cc/hr           We'll check his basic metabolic panel and phosphorus  in the morning.               LOS: 4 days   Donald Sampson S 09/12/2013,9:36 AM

## 2013-09-12 NOTE — Discharge Summary (Signed)
Physician Discharge Summary  Patient ID: Donald Sampson MRN: [id number] DOB/AGE: [birth year] 37 y.o. Primary Care Physician:Mitchelle Sultan, MD Admit date: 09/08/2013 Discharge date: 09/12/2013    Discharge Diagnoses:  1.Diabetic Ketoacidosis  2. Hyponatremia  3. Acute on chronic renal failure  4. Hypertension  5. Bilateral foot ulcer  6. Osteomyelitis rt foot  7. Legally Blind  8. Anemia  9. Medical noncomplaince   Active Problems:   * No active hospital problems. *     Medication List    STOP taking these medications       levofloxacin 500 MG tablet  Commonly known as:  LEVAQUIN      TAKE these medications       amLODipine 5 MG tablet  Commonly known as:  NORVASC  Take 5 mg by mouth daily.     brimonidine 0.2 % ophthalmic solution  Commonly known as:  ALPHAGAN  Place 1 drop into the left eye 3 (three) times daily.     furosemide 20 MG tablet  Commonly known as:  LASIX  Take 20 mg by mouth 2 (two) times daily.     glyBURIDE 5 MG tablet  Commonly known as:  DIABETA  Take 5 mg by mouth daily with breakfast.     insulin aspart 100 UNIT/ML injection  Commonly known as:  novoLOG  Inject 15 Units into the skin 3 (three) times daily with meals.     insulin glargine 100 UNIT/ML injection  Commonly known as:  LANTUS  Inject 30 Units into the skin at bedtime.     linagliptin 5 MG Tabs tablet  Commonly known as:  TRADJENTA  Take 5 mg by mouth daily.     lisinopril 20 MG tablet  Commonly known as:  PRINIVIL,ZESTRIL  Take 20 mg by mouth daily.     sodium chloride 0.9 % SOLN 500 mL with vancomycin 10 G SOLR 1,500 mg  Inject 1,500 mg into the vein every other day.        Discharged Condition: improved    Consults: Nephrology, endocrinology, patient declined general surgery  Significant Diagnostic Studies: Dg Foot Complete Left  09/08/2013   CLINICAL DATA:  Diabetes.  Swelling.  Possible osteomyelitis.  EXAM: LEFT FOOT - COMPLETE 3+ VIEW   COMPARISON:  MRI foot 06/17/2013.  FINDINGS: Extensive soft tissue swelling is noted about the left foot particularly over the left 5th digit and lateral aspect of the foot. Subcutaneous air is noted adjacent to the left 5th metatarsal. Destructive changes are noted of the proximal and distal portions of the 5th metatarsal and proximal left 5th phalanx. These findings are consistent with persistent or recurrent osteomyelitis of the left 5th digit with adjacent cellulitis and possible soft tissue abscess. The osteomyelitis most likely has a chronic component as callus formation is present.  IMPRESSION: Extensive changes suggesting osteomyelitis of the left 5th metatarsal and proximal left 5th phalanx. Severe adjacent soft tissue swelling and soft tissue air is noted suggesting adjacent cellulitis and possible soft tissue abscess. These results will be called to the ordering clinician or representative by the Radiologist Assistant, and communication documented in the PACS Dashboard.   Electronically Signed   By: Maisie Fus  Register   On: 09/08/2013 14:45   Dg Foot Complete Right  09/08/2013   CLINICAL DATA:  Right foot pain and swelling.  History of diabetes.  EXAM: RIGHT FOOT COMPLETE - 3+ VIEW  COMPARISON:  None  FINDINGS: There is no evidence of acute fracture, subluxation or dislocation.  No  radiographic evidence of osteomyelitis is noted.  The joint spaces are within normal limits.  No focal bony abnormalities are present.  The Lisfranc joints are unremarkable.  Dorsal soft tissue swelling is present.  IMPRESSION: Soft tissue swelling without bony abnormality.   Electronically Signed   By: Laveda Abbe M.D.   On: 09/08/2013 14:40    Lab Results: Basic Metabolic Panel:  Recent Labs  16/10/96 0129 09/12/13 0557  NA 129* 128*  K 3.9 4.0  CL 93* 93*  CO2 23 23  GLUCOSE 192* 234*  BUN 55* 50*  CREATININE 4.55* 3.91*  CALCIUM 8.6 8.5  PHOS  --  3.5   Liver Function Tests: No results found for this  basename: AST, ALT, ALKPHOS, BILITOT, PROT, ALBUMIN,  in the last 72 hours   CBC:  Recent Labs  09/11/13 0129 09/12/13 0557  WBC 17.8* 18.9*  HGB 8.6* 8.9*  HCT 24.9* 26.7*  MCV 72.6* 74.0*  PLT 480* 517*    Recent Results (from the past 240 hour(s))  MRSA PCR SCREENING     Status: None   Collection Time    09/08/13  5:29 PM      Result Value Range Status   MRSA by PCR NEGATIVE  NEGATIVE Final   Comment:            The GeneXpert MRSA Assay (FDA     approved for NASAL specimens     only), is one component of a     comprehensive MRSA colonization     surveillance program. It is not     intended to diagnose MRSA     infection nor to guide or     monitor treatment for     MRSA infections.  CULTURE, BLOOD (ROUTINE X 2)     Status: None   Collection Time    09/11/13  1:24 AM      Result Value Range Status   Specimen Description BLOOD LEFT ANTECUBITAL   Final   Special Requests BOTTLES DRAWN AEROBIC AND ANAEROBIC 8CC   Final   Culture NO GROWTH <24 HRS   Final   Report Status PENDING   Incomplete  CULTURE, BLOOD (ROUTINE X 2)     Status: None   Collection Time    09/11/13  1:29 AM      Result Value Range Status   Specimen Description BLOOD BLOOD LEFT HAND   Final   Special Requests BOTTLES DRAWN AEROBIC AND ANAEROBIC 9CC   Final   Culture NO GROWTH <24 HRS   Final   Report Status PENDING   Incomplete     Hospital Course:  This is a 37 years old male patient with history of multiple medical illnesses who is noncompliant on his treatment was admitted due DKA, bilateral foot ulcer and anemia. He was admitted to ICU was treated with Iv insulin, Iv fluid and combination Iv antibiotics. He was transfused a total of 4 units. Patient refused to see general surgery or orthopedist here to evaluate his foot ulcer. He want to see only his podiatrist in [place] who doesn't came to this hospital. He was seen by nephrologist and endocrinologist here and assisted in his treatment. His blood  sugar improved. However, has hyponatremia and significant renal failure. Patient wants to go home. He refuse to stay here for continuation of his treatment. Will make arrangement with home health for wound care and iV antibiotics and discharge him home.  Discharge Exam: Blood pressure 155/84, pulse 11, temperature  100.7 F (38.2 C), temperature source Oral, resp. rate 20, height 6\' 1"  (1.854 m), weight 132.9 kg (292 lb 15.9 oz), SpO2 100.00%.   Disposition:  Home        Follow-up Information   Follow up with [facility].   Contact information:   [address] [place] [address] [phone]      Signed: Avon Gully   09/12/2013, 8:11 AM

## 2013-09-12 NOTE — Progress Notes (Signed)
Patient discharged home.  Peripheral IVs removed - WNL.  PICC ready to use - flushed with 10 cc NS and 250 unit Heparin flush.  WNL.  HH set up and to start home IV Abx administration tomorrow.  F/U in place with Dr. Fransico Him for blood sugar control.  Educated on renal and carb mod diet.  Pt verbalizes that he understands DC instructions.  To resume wound care in [place].  Has questions about pain medication from wound MD.  States he will call to ask wound MD as pain medication was not ordered for hospital stay. No questions at this time.  Stable to DC home.  Awaiting ride to arrive.

## 2013-09-12 NOTE — Progress Notes (Addendum)
ANTIBIOTIC CONSULT NOTE  Pharmacy Consult for Vancomycin Indication: osteomyelitis  No Known Allergies  Patient Measurements: Height: 6\' 1"  (185.4 cm) Weight: 292 lb 15.9 oz (132.9 kg) IBW/kg (Calculated) : 79.9  Vital Signs: Temp: 100.7 F (38.2 C) (11/17 0537) Temp src: Oral (11/17 0537) BP: 155/84 mmHg (11/17 0537) Pulse Rate: 11 (11/17 0537) Intake/Output from previous day: 11/16 0701 - 11/17 0700 In: 775 [P.O.:600; I.V.:125; IV Piggyback:50] Out: 425 [Urine:425] Intake/Output from this shift:    Labs:  Recent Labs  09/10/13 0555 09/11/13 0129 09/12/13 0557  WBC 17.2* 17.8* 18.9*  HGB 8.3* 8.6* 8.9*  PLT 436* 480* 517*  CREATININE 5.42* 4.55* 3.91*   Estimated Creatinine Clearance: 37.3 ml/min (by C-G formula based on Cr of 3.91). No results found for this basename: VANCOTROUGH, Leodis Binet, VANCORANDOM, GENTTROUGH, GENTPEAK, GENTRANDOM, TOBRATROUGH, TOBRAPEAK, TOBRARND, AMIKACINPEAK, AMIKACINTROU, AMIKACIN,  in the last 72 hours   Microbiology: Recent Results (from the past 720 hour(s))  MRSA PCR SCREENING     Status: None   Collection Time    09/08/13  5:29 PM      Result Value Range Status   MRSA by PCR NEGATIVE  NEGATIVE Final   Comment:            The GeneXpert MRSA Assay (FDA     approved for NASAL specimens     only), is one component of a     comprehensive MRSA colonization     surveillance program. It is not     intended to diagnose MRSA     infection nor to guide or     monitor treatment for     MRSA infections.  CULTURE, BLOOD (ROUTINE X 2)     Status: None   Collection Time    09/11/13  1:24 AM      Result Value Range Status   Specimen Description BLOOD LEFT ANTECUBITAL   Final   Special Requests BOTTLES DRAWN AEROBIC AND ANAEROBIC 8CC   Final   Culture NO GROWTH 1 DAY   Final   Report Status PENDING   Incomplete  CULTURE, BLOOD (ROUTINE X 2)     Status: None   Collection Time    09/11/13  1:29 AM      Result Value Range Status   Specimen Description BLOOD BLOOD LEFT HAND   Final   Special Requests BOTTLES DRAWN AEROBIC AND ANAEROBIC 9CC   Final   Culture NO GROWTH 1 DAY   Final   Report Status PENDING   Incomplete    Medical History: Past Medical History  Diagnosis Date  . Diabetes mellitus   . Hypertension   . Glaucoma (increased eye pressure) 2011  . Microcytic anemia 04/25/2013  . 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 amLODipine  5 mg Oral Daily  . brimonidine  1 drop Left Eye TID  . calcium acetate  667 mg Oral TID WC  . epoetin alfa  10,000 Units Subcutaneous Q Sat-1800  . heparin subcutaneous  5,000 Units Subcutaneous Q8H  . insulin aspart  15 Units Subcutaneous TID WC  . insulin glargine  65 Units Subcutaneous Daily  . piperacillin-tazobactam (ZOSYN)  IV  2.25 g Intravenous Q6H  . vancomycin  1,500 mg Intravenous Q48H    Assessment: 37 yo obese M with CKD.  [facility]  Xray today reveals osteomyelitis of LLE (left 5th metatarsal and proximal left 5th phalanx).  Renal status is improving.  Normalized CrCl ~ 30-40 ml/min. Noted plan for patient discharge today.  Vancomycin 11/13>> Zosyn 11/13>>  Goal of Therapy:  Vancomycin trough level 15-20 mcg/ml  Plan:  Recommend increase Vancomycin to 1500mg  IV q24h Continue oral Levaquin 500mg  daily or 750mg  qoday Check Vancomycin trough at steady state Monitor renal function and cx data   Elson Clan 09/12/2013,11:01 AM   Addendum: Discussed with Dr Felecia Shelling.   He states to continue IV Vanc x 2 weeks and additional therapy per his podiatrist (usual treatment duration for osteo is 6 weeks).  Also he does not feel this patient needs gram-negative coverage given history of MRSA in the past.   Junita Push, PharmD, BCPS 09/12/2013@11 :49 AM

## 2013-09-12 NOTE — Progress Notes (Signed)
Subjective: F/U for uncontrolled type 2 DM.  Objective: Donald Sampson is out of the ICU, glycemic control is better today on basal/bolus insulin. Eating well. No hypoglycemia. Vital signs in last 24 hours: Filed Vitals:   09/11/13 0955 09/11/13 1300 09/11/13 2041 09/12/13 0537  BP: 142/78 143/78 161/80 155/84  Pulse: 102 92 109 11  Temp:  98.2 F (36.8 C) 100.8 F (38.2 C) 100.7 F (38.2 C)  TempSrc:  Oral Oral Oral  Resp:  20 20 20   Height:      Weight:      SpO2:  97% 100% 100%    Intake/Output Summary (Last 24 hours) at 09/12/13 1308 Last data filed at 09/11/13 1828  Gross per 24 hour  Intake    240 ml  Output      0 ml  Net    240 ml   Weight change:  Gen: awake in ICU bed, no distress  Heent: well hydrated  Neck: No jVD, no goiter.  Abdomen: obese, non tender  Ext: dressed ulcers on bilateral feet. Discolored on both feet.  Resp: CLAB  CVS: s1 and s2 well heard, no murmur.  Skin: discoloration of feet, poor pulses on PT, and DP arteries.   Lab Results: Basic Metabolic Panel:  Recent Labs  16/10/96 0129 09/12/13 0557  NA 129* 128*  K 3.9 4.0  CL 93* 93*  CO2 23 23  GLUCOSE 192* 234*  BUN 55* 50*  CREATININE 4.55* 3.91*  CALCIUM 8.6 8.5  PHOS  --  3.5   CBC:  Recent Labs  09/11/13 0129 09/12/13 0557  WBC 17.8* 18.9*  HGB 8.6* 8.9*  HCT 24.9* 26.7*  MCV 72.6* 74.0*  PLT 480* 517*   Cardiac Enzymes: No results found for this basename: CKTOTAL, CKMB, CKMBINDEX, TROPONINI,  in the last 72 hours BNP: No components found with this basename: POCBNP,  D-Dimer: No results found for this basename: DDIMER,  in the last 72 hours CBG:  Recent Labs  09/11/13 1635 09/11/13 2037 09/12/13 0127 09/12/13 0418 09/12/13 0756 09/12/13 1131  GLUCAP 269* 269* 310* 306* 164* 159*   Hemoglobin A1C: 8%.    Medications:  Scheduled Meds: . amLODipine  5 mg Oral Daily  . brimonidine  1 drop Left Eye TID  . calcium acetate  667 mg Oral TID WC  .  epoetin alfa  10,000 Units Subcutaneous Q Sat-1800  . heparin subcutaneous  5,000 Units Subcutaneous Q8H  . insulin aspart  15 Units Subcutaneous TID WC  . insulin glargine  65 Units Subcutaneous Daily  . piperacillin-tazobactam (ZOSYN)  IV  2.25 g Intravenous Q6H  . vancomycin  1,500 mg Intravenous Q24H   Continuous Infusions: . 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  Increase Lantus to 65 units qhs, and increase Novolog to 15 units TIDAC plus correction dose Novolog.  It appears patient is being discharged for out patient surgical evaluation.  he is a patient with multiple complication and non compliance.  i have re-counseled him to engage in intensive therapy with insulin and offered him out patient f/u a week after discharge.  he remains  at a very high risk for more acute and chronic complications which include CAD, CVA, ESRD, retinopathy, and neuropathy. These are all discussed in detail with the patient.  I have counseled the patient on diet management and weight loss, by adopting a carbohydrate restricted diet.  -his oral medications will be resumed as appropriate as out patient.    LOS: 4 days   Donald Sampson,Donald Sampson 09/12/2013, 1:08 PM

## 2013-09-16 ENCOUNTER — Inpatient Hospital Stay (HOSPITAL_COMMUNITY)
Admission: RE | Admit: 2013-09-16 | Discharge: 2013-09-16 | Disposition: A | Discharge status: Home / Self Care | Payer: Medicaid Other | Source: Ambulatory Visit

## 2013-09-16 LAB — CULTURE, BLOOD (ROUTINE X 2): Culture: NO GROWTH

## 2013-09-16 NOTE — Progress Notes (Signed)
No show for 1130 blood draw appt. Called pt. Stated that he was admitted to [facility] today. Would not be coming for blood draw or blood transfusion. Attempted to call Dr Letitia Neri office, no answer.

## 2013-10-03 ENCOUNTER — Encounter (HOSPITAL_COMMUNITY): Payer: Self-pay | Admitting: Oncology

## 2013-10-31 ENCOUNTER — Encounter (HOSPITAL_COMMUNITY): Payer: Self-pay | Admitting: Emergency Medicine

## 2013-10-31 ENCOUNTER — Emergency Department (HOSPITAL_COMMUNITY)
Admission: EM | Admit: 2013-10-31 | Discharge: 2013-11-01 | Disposition: A | Discharge status: Home / Self Care | Payer: Medicaid Other | Attending: Emergency Medicine | Admitting: Emergency Medicine

## 2013-10-31 DIAGNOSIS — Z794 Long term (current) use of insulin: Secondary | ICD-10-CM | POA: Insufficient documentation

## 2013-10-31 DIAGNOSIS — D649 Anemia, unspecified: Secondary | ICD-10-CM

## 2013-10-31 DIAGNOSIS — Z79899 Other long term (current) drug therapy: Secondary | ICD-10-CM | POA: Insufficient documentation

## 2013-10-31 DIAGNOSIS — E119 Type 2 diabetes mellitus without complications: Secondary | ICD-10-CM | POA: Insufficient documentation

## 2013-10-31 DIAGNOSIS — I10 Essential (primary) hypertension: Secondary | ICD-10-CM | POA: Insufficient documentation

## 2013-10-31 DIAGNOSIS — H409 Unspecified glaucoma: Secondary | ICD-10-CM | POA: Insufficient documentation

## 2013-10-31 DIAGNOSIS — Z87828 Personal history of other (healed) physical injury and trauma: Secondary | ICD-10-CM | POA: Insufficient documentation

## 2013-10-31 DIAGNOSIS — Z8669 Personal history of other diseases of the nervous system and sense organs: Secondary | ICD-10-CM | POA: Insufficient documentation

## 2013-10-31 DIAGNOSIS — D509 Iron deficiency anemia, unspecified: Secondary | ICD-10-CM | POA: Insufficient documentation

## 2013-10-31 LAB — GLUCOSE, CAPILLARY: Glucose-Capillary: 133 mg/dL — ABNORMAL HIGH (ref 70–99)

## 2013-10-31 NOTE — ED Notes (Addendum)
Sent by Dr Felecia ShellingFanta, with hgb of 6.8 Has dressing  And ortho shoe on lt foot, due to diabetic ulcer. Has a PICC  In rt arm
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
## 2013-11-01 LAB — BASIC METABOLIC PANEL
BUN: 73 mg/dL — AB (ref 6–23)
CHLORIDE: 95 meq/L — AB (ref 96–112)
CO2: 19 mEq/L (ref 19–32)
CREATININE: 4.11 mg/dL — AB (ref 0.50–1.35)
Calcium: 9.1 mg/dL (ref 8.4–10.5)
GFR calc Af Amer: 20 mL/min — ABNORMAL LOW (ref >90)
GFR calc non Af Amer: 17 mL/min — ABNORMAL LOW (ref >90)
Glucose, Bld: 180 mg/dL — ABNORMAL HIGH (ref 70–99)
POTASSIUM: 5.6 meq/L — AB (ref 3.7–5.3)
Sodium: 130 mEq/L — ABNORMAL LOW (ref 137–147)

## 2013-11-01 LAB — CBC WITH DIFFERENTIAL/PLATELET
BASOS PCT: 0 % (ref 0–1)
Basophils Absolute: 0 10*3/uL (ref 0.0–0.1)
Eosinophils Absolute: 0.4 10*3/uL (ref 0.0–0.7)
Eosinophils Relative: 3 % (ref 0–5)
HCT: 22.7 % — ABNORMAL LOW (ref 39.0–52.0)
HEMOGLOBIN: 7.4 g/dL — AB (ref 13.0–17.0)
LYMPHS ABS: 1.1 10*3/uL (ref 0.7–4.0)
LYMPHS PCT: 8 % — AB (ref 12–46)
MCH: 24.4 pg — ABNORMAL LOW (ref 26.0–34.0)
MCHC: 32.6 g/dL (ref 30.0–36.0)
MCV: 74.9 fL — AB (ref 78.0–100.0)
Monocytes Absolute: 1.1 10*3/uL — ABNORMAL HIGH (ref 0.1–1.0)
Monocytes Relative: 8 % (ref 3–12)
Neutro Abs: 10.9 10*3/uL — ABNORMAL HIGH (ref 1.7–7.7)
Neutrophils Relative %: 81 % — ABNORMAL HIGH (ref 43–77)
Platelets: 376 10*3/uL (ref 150–400)
RBC: 3.03 MIL/uL — ABNORMAL LOW (ref 4.22–5.81)
RDW: 18.3 % — ABNORMAL HIGH (ref 11.5–15.5)
WBC: 13.5 10*3/uL — ABNORMAL HIGH (ref 4.0–10.5)

## 2013-11-01 LAB — TYPE AND SCREEN
ABO/RH(D): A POS
Antibody Screen: NEGATIVE

## 2013-11-01 NOTE — Discharge Instructions (Signed)
Hemoglobin is 7.4     Followup at short stay tomorrow for a transfusion as ordered

## 2013-11-22 IMAGING — NM NM RENAL IMAGING FLOW W/ PHARM
2 series · 12 of 12 positions shown · non-contrast
Comparison: None
Correlation:  CT abdomen and pelvis 01/01/2013, renal ultrasound
11/08/2012

CLINICAL DATA: Right hydronephrosis

NUCLEAR MEDICINE RENAL SCAN WITH DIURETIC ADMINISTRATION
TECHNIQUE: Radionuclide angiographic and sequential renal images
were obtained after intravenous injection of radiopharmaceutical.
Imaging was continued during slow intravenous injection of Lasix
approximately 20 minutes after the start of the examination.
Radiopharmaceutical:  15.0 mCi Fc-00m MAG3
Pharmaceutical:  60 mg furosemide IV

[Series 1: re renal qualitative · 9.51mm/px · 6 of 130 frames shown (1 of 2)]
[frame 11/130]
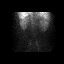
[frame 33/130]
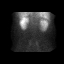
[frame 55/130]
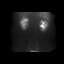
[frame 76/130]
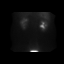
[frame 98/130]
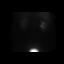
[frame 120/130]
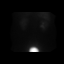

[Series 1: re renal qualitative · 9.51mm/px · 6 of 130 frames shown (2 of 2)]
[frame 11/130]
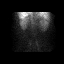
[frame 33/130]
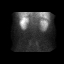
[frame 55/130]
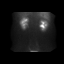
[frame 76/130]
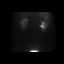
[frame 98/130]
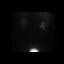
[frame 120/130]
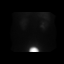

[12 of 12 positions shown; findings below may reference images not displayed]

FINDINGS: Symmetric blood flow to both kidneys.
Both kidneys demonstrate prompt uptake, concentration, and
excretion of tracer.
Renal excretion appears normal prior to Lasix and continues fall
following diuresis.
Right renal collecting system appears dilated though there is good
clearance of tracer from the cortex and collecting system following
diuretic administration.
At the conclusion of the exam, no significant cortical or
collecting system retention of tracer is identified in either
kidney.

Differential renal function is 54% left kidney versus 46% right.
Normal blood flow curves in both kidneys.
Left kidney demonstrates normal time to peak activity of
minutes with fall to half-maximum activity at 16.5 minutes.
Right kidney demonstrates a time to peak activity of 9 minutes with
fall to half-maximum activity at 22 minutes.
IMPRESSION: Mild right renal collecting system dilatation.
No evidence of urinary outflow obstruction by diuretic radionuclide
renography.

## 2014-01-02 ENCOUNTER — Encounter (HOSPITAL_COMMUNITY): Payer: Self-pay | Admitting: Emergency Medicine

## 2014-01-02 ENCOUNTER — Emergency Department (HOSPITAL_COMMUNITY)
Admission: EM | Admit: 2014-01-02 | Discharge: 2014-01-02 | Disposition: A | Discharge status: Home / Self Care | Payer: Medicaid Other | Attending: Emergency Medicine | Admitting: Emergency Medicine

## 2014-01-02 DIAGNOSIS — G8929 Other chronic pain: Secondary | ICD-10-CM | POA: Diagnosis not present

## 2014-01-02 DIAGNOSIS — I10 Essential (primary) hypertension: Secondary | ICD-10-CM | POA: Diagnosis not present

## 2014-01-02 DIAGNOSIS — Z79899 Other long term (current) drug therapy: Secondary | ICD-10-CM | POA: Insufficient documentation

## 2014-01-02 DIAGNOSIS — H543 Unqualified visual loss, both eyes: Secondary | ICD-10-CM | POA: Diagnosis not present

## 2014-01-02 DIAGNOSIS — Z794 Long term (current) use of insulin: Secondary | ICD-10-CM | POA: Diagnosis not present

## 2014-01-02 DIAGNOSIS — R51 Headache: Secondary | ICD-10-CM | POA: Insufficient documentation

## 2014-01-02 DIAGNOSIS — E119 Type 2 diabetes mellitus without complications: Secondary | ICD-10-CM | POA: Insufficient documentation

## 2014-01-02 DIAGNOSIS — Z862 Personal history of diseases of the blood and blood-forming organs and certain disorders involving the immune mechanism: Secondary | ICD-10-CM | POA: Diagnosis not present

## 2014-01-02 DIAGNOSIS — H409 Unspecified glaucoma: Secondary | ICD-10-CM | POA: Insufficient documentation

## 2014-01-02 DIAGNOSIS — H5789 Other specified disorders of eye and adnexa: Secondary | ICD-10-CM | POA: Diagnosis not present

## 2014-01-02 HISTORY — DX: Unqualified visual loss, both eyes: H54.3

## 2014-01-02 LAB — CBG MONITORING, ED: GLUCOSE-CAPILLARY: 304 mg/dL — AB (ref 70–99)

## 2014-01-02 MED ORDER — OXYCODONE-ACETAMINOPHEN 5-325 MG PO TABS
1.0000 | ORAL_TABLET | Freq: Once | ORAL | Status: AC
  Administered 2014-01-02: 1 via ORAL
  Filled 2014-01-02: qty 1

## 2014-01-02 MED ORDER — LISINOPRIL 20 MG PO TABS: 10.0000 mg | ORAL_TABLET | Freq: Every day | ORAL | Status: DC

## 2014-01-02 MED ORDER — OXYCODONE-ACETAMINOPHEN 5-325 MG PO TABS: 1.0000 | ORAL_TABLET | Freq: Four times a day (QID) | ORAL | Status: DC | PRN

## 2014-01-02 NOTE — ED Notes (Signed)
Pt states he has been out of blood pressure medication for several weeks. He checked his BP this am and it was "200/something" for which he took two of his mom's BP medicine as it was same as his.  He has glaucoma and states he thinks his pressure is up in his L. Eye. Pt is diabetic. Significant other at bedside wants us to check his sugar as well because she states it has been over 600 for last few days.

## 2014-01-02 NOTE — ED Notes (Signed)
States he has been out of his blood pressure medication for months

## 2014-01-02 NOTE — ED Provider Notes (Signed)
CSN: [id number]     Arrival date & time 01/02/14  1030 History  This chart was scribed for Donald Lennert, MD,  by Ashley Jacobs, ED Scribe. The patient was seen in room APA06/APA06 and the patient's care was started at 12:29 PM.   First MD Initiated Contact with Patient 01/02/14 1223     Chief Complaint  Patient presents with  . Hypertension     (Consider location/radiation/quality/duration/timing/severity/associated sxs/prior Treatment) Patient is a 38 y.o. male presenting with hypertension. The history is provided by the patient and medical records.  Hypertension This is a chronic problem. The problem occurs constantly. The problem has not changed since onset.Associated symptoms include headaches. Nothing relieves the symptoms.   HPI Comments: [name] is a 38 y.o. male who presents to the Emergency Department complaining of HTN, onset of this morning. Pt mentions that he ran out of BP medications and he was concerned about his eye pressure. Pt had conflicts with his medicaid and has run out of medications for his BP and DM. He has not taken any medication for two weeks. Pt took his mother Norvasc this morning. Pt is typically takes two Lisinopril 10 mg and 5 Norvasc. Pt has the associated symptoms of eye problems (runny discharge) and headache. Pt mentions when his BP increases he has problems with is glaucoma. He has not visited his ophthalmologist in a while.  Past Medical History  Diagnosis Date  . Diabetes mellitus   . Hypertension   . Glaucoma (increased eye pressure) 2011  . Microcytic anemia 04/25/2013  . 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 Blind in both eyes    Past Surgical History  Procedure Laterality Date  . Refractive surgery     No family history on file. History  Substance Use Topics  . Smoking status: Never Smoker   . Smokeless tobacco: Never Used  . Alcohol Use: No    Review of Systems  Eyes: Positive for discharge and redness.  Neurological:  Positive for headaches.  All other systems reviewed and are negative.      Allergies  Review of patient's allergies indicates no known allergies.  Home Medications   Current Outpatient Rx  Name  Route  Sig  Dispense  Refill  . amLODipine (NORVASC) 5 MG tablet   Oral   Take 5 mg by mouth daily.         . brimonidine (ALPHAGAN) 0.2 % ophthalmic solution   Left Eye   Place 1 drop into the left eye 3 (three) times daily.         . furosemide (LASIX) 20 MG tablet   Oral   Take 20 mg by mouth 2 (two) times daily.         Marland Kitchen glyBURIDE (DIABETA) 5 MG tablet   Oral   Take 5 mg by mouth daily with breakfast.         . insulin aspart (NOVOLOG) 100 UNIT/ML injection   Subcutaneous   Inject 15 Units into the skin 3 (three) times daily with meals.   1 vial   12   . insulin glargine (LANTUS) 100 UNIT/ML injection   Subcutaneous   Inject 30 Units into the skin at bedtime.          Marland Kitchen linagliptin (TRADJENTA) 5 MG TABS tablet   Oral   Take 5 mg by mouth daily.         Marland Kitchen lisinopril (PRINIVIL,ZESTRIL) 20 MG tablet   Oral   Take 20 mg  by mouth daily.          BP 122/67  Pulse 102  Temp(Src) 98.1 F (36.7 C) (Oral)  Resp 18  Ht 6\' 1"  (1.854 m)  Wt 260 lb (117.935 kg)  BMI 34.31 kg/m2  SpO2 100% Physical Exam  Nursing note and vitals reviewed. Constitutional: He is oriented to person, place, and time. He appears well-developed and well-nourished. No distress.  HENT:  Head: Normocephalic and atraumatic.  Left eye: Conjunctiva inflamed Pupil is non reactive    Neck: Normal range of motion. Neck supple. No tracheal deviation present.  Cardiovascular: Normal rate.   Pulmonary/Chest: Effort normal. No respiratory distress.  Abdominal: Soft. He exhibits no distension.  Musculoskeletal: Normal range of motion.  Neurological: He is alert and oriented to person, place, and time.  Skin: Skin is warm and dry.  Psychiatric: He has a normal mood and affect. His  behavior is normal.    ED Course  Procedures (including critical care time) DIAGNOSTIC STUDIES: Oxygen Saturation is 100% on room air, normal by my interpretation.    COORDINATION OF CARE:  12:33 PM Discussed course of care with pt . Pt understands and agrees.   Labs Review Labs Reviewed  CBG MONITORING, ED - Abnormal; Notable for the following:    Glucose-Capillary 304 (*)    All other components within normal limits   Imaging Review No results found.   EKG Interpretation None      MDM   Final diagnoses:  None  The chart was scribed for me under my direct supervision.  I personally performed the history, physical, and medical decision making and all procedures in the evaluation of this patient..   htn poorly controled  Donald LennertJoseph L Reginae Wolfrey, MD 01/02/14 [phone]

## 2014-01-02 NOTE — Discharge Instructions (Signed)
Follow up with your md in 1-2 weeks. °

## 2014-05-30 ENCOUNTER — Emergency Department (HOSPITAL_COMMUNITY)
Admission: EM | Admit: 2014-05-30 | Discharge: 2014-05-30 | Disposition: A | Discharge status: Home / Self Care | Payer: Medicaid Other | Attending: Emergency Medicine | Admitting: Emergency Medicine

## 2014-05-30 ENCOUNTER — Encounter (HOSPITAL_COMMUNITY): Payer: Self-pay | Admitting: Emergency Medicine

## 2014-05-30 DIAGNOSIS — Z862 Personal history of diseases of the blood and blood-forming organs and certain disorders involving the immune mechanism: Secondary | ICD-10-CM | POA: Diagnosis not present

## 2014-05-30 DIAGNOSIS — E119 Type 2 diabetes mellitus without complications: Secondary | ICD-10-CM | POA: Diagnosis not present

## 2014-05-30 DIAGNOSIS — H5710 Ocular pain, unspecified eye: Secondary | ICD-10-CM | POA: Insufficient documentation

## 2014-05-30 DIAGNOSIS — I10 Essential (primary) hypertension: Secondary | ICD-10-CM | POA: Insufficient documentation

## 2014-05-30 DIAGNOSIS — Z794 Long term (current) use of insulin: Secondary | ICD-10-CM | POA: Diagnosis not present

## 2014-05-30 DIAGNOSIS — H543 Unqualified visual loss, both eyes: Secondary | ICD-10-CM | POA: Diagnosis not present

## 2014-05-30 DIAGNOSIS — Z79899 Other long term (current) drug therapy: Secondary | ICD-10-CM | POA: Insufficient documentation

## 2014-05-30 DIAGNOSIS — Z760 Encounter for issue of repeat prescription: Secondary | ICD-10-CM | POA: Diagnosis not present

## 2014-05-30 MED ORDER — LINAGLIPTIN 5 MG PO TABS: 5.0000 mg | ORAL_TABLET | Freq: Every day | ORAL | Status: DC

## 2014-05-30 MED ORDER — OXYCODONE-ACETAMINOPHEN 5-325 MG PO TABS: 1.0000 | ORAL_TABLET | ORAL | Status: DC | PRN

## 2014-05-30 MED ORDER — BRIMONIDINE TARTRATE 0.2 % OP SOLN: 1.0000 [drp] | Freq: Three times a day (TID) | OPHTHALMIC | Status: DC

## 2014-05-30 NOTE — Discharge Instructions (Signed)
Medication Refill, Emergency Department °We have refilled your medication today as a courtesy to you. It is best for your medical care, however, to take care of getting refills done through your primary caregiver's office. They have your records and can do a better job of follow-up than we can in the emergency department. °On maintenance medications, we often only prescribe enough medications to get you by until you are able to see your regular caregiver. This is a more expensive way to refill medications. °In the future, please plan for refills so that you will not have to use the emergency department for this. °Thank you for your help. Your help allows us to better take care of the daily emergencies that enter our department. °Document Released: 01/30/2004 Document Revised: 01/05/2012 Document Reviewed: 01/20/2014 °[employer]® Patient Information ©2015 [employer]. This information is not intended to replace advice given to you by your health care provider. Make sure you discuss any questions you have with your health care provider. ° °

## 2014-05-30 NOTE — ED Notes (Signed)
Pt reports has been without his medications for the past 4 or 5 months.  Reports has been having pain and pressure in left eye.  Reports has not has his glaucoma drops either.

## 2014-05-30 NOTE — ED Provider Notes (Signed)
CSN: [id number]     Arrival date & time 05/30/14  0750 History   First MD Initiated Contact with Patient 05/30/14 [phone]     Chief Complaint  Patient presents with  . Eye Pain     (Consider location/radiation/quality/duration/timing/severity/associated sxs/prior Treatment) The history is provided by the patient.  Donald Sampson is a 38 y.o. male who presents to the Emergency Department requesting refill of his pain medication and glaucoma drops.  States he has been without these medication for several months and temporary lost his Medicaid.  He reports that it has recently been reinstated and he has appt with a new PMD but not until October.  He also c/o pain and pressure to the left eye, but states that has been present for "long time" he denies fever, vomiting, neck  Pain or stiffness, chest pain or headaches.   Past Medical History  Diagnosis Date  . Diabetes mellitus   . Hypertension   . Glaucoma (increased eye pressure) 2011  . Microcytic anemia 04/25/2013  . 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 Blind in both eyes    Past Surgical History  Procedure Laterality Date  . Refractive surgery    . Left bka     No family history on file. History  Substance Use Topics  . Smoking status: Never Smoker   . Smokeless tobacco: Never Used  . Alcohol Use: No    Review of Systems  Constitutional: Negative for fever, activity change and appetite change.  HENT: Negative for sore throat.   Eyes: Positive for pain.  Gastrointestinal: Negative for nausea, vomiting and abdominal pain.  Musculoskeletal: Negative for arthralgias.  Skin: Negative for rash and wound.  Neurological: Negative for dizziness, seizures, syncope, speech difficulty, weakness and headaches.  Psychiatric/Behavioral: Negative for dysphoric mood. The patient is not nervous/anxious.   All other systems reviewed and are negative.     Allergies  Review of patient's allergies indicates no known allergies.  Home  Medications   Prior to Admission medications   Medication Sig Start Date End Date Taking? Authorizing Provider  amLODipine (NORVASC) 5 MG tablet Take 5 mg by mouth daily.    Historical Provider, MD  brimonidine (ALPHAGAN) 0.2 % ophthalmic solution Place 1 drop into the left eye 3 (three) times daily.    Historical Provider, MD  furosemide (LASIX) 20 MG tablet Take 20 mg by mouth 2 (two) times daily.    Historical Provider, MD  glyBURIDE (DIABETA) 5 MG tablet Take 5 mg by mouth daily with breakfast.    Historical Provider, MD  insulin aspart (NOVOLOG) 100 UNIT/ML injection Inject 15 Units into the skin 3 (three) times daily with meals. 09/12/13   Avon Gully, MD  insulin glargine (LANTUS) 100 UNIT/ML injection Inject 30 Units into the skin at bedtime.     Historical Provider, MD  linagliptin (TRADJENTA) 5 MG TABS tablet Take 5 mg by mouth daily.    Historical Provider, MD  lisinopril (PRINIVIL,ZESTRIL) 20 MG tablet Take 20 mg by mouth daily.    Historical Provider, MD  lisinopril (PRINIVIL,ZESTRIL) 20 MG tablet Take 0.5 tablets (10 mg total) by mouth daily. 01/02/14   Benny Lennert, MD  oxyCODONE-acetaminophen (PERCOCET/ROXICET) 5-325 MG per tablet Take 1 tablet by mouth every 6 (six) hours as needed for severe pain. 01/02/14   Benny Lennert, MD   BP 180/104  Pulse 112  Temp(Src) 97 F (36.1 C) (Oral)  Resp 20  Ht 6\' 1"  (1.854 m)  Wt 265 lb (120.203  kg)  BMI 34.97 kg/m2  SpO2 100% Physical Exam  Nursing note and vitals reviewed. Constitutional: He is oriented to person, place, and time. He appears well-developed and well-nourished. No distress.  HENT:  Head: Normocephalic and atraumatic.  Mouth/Throat: Oropharynx is clear and moist.  Eyes: Lids are everted and swept, no foreign bodies found. Right conjunctiva is not injected. Left conjunctiva is not injected.  Left conjunctiva is inflamed and left pupil is non-reactive.  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 Neck supple. No thyromegaly present.  Cardiovascular: Normal rate, regular rhythm, normal heart sounds and intact distal pulses.   No murmur heard. Pulmonary/Chest: Effort normal and breath sounds normal. No respiratory distress.  Musculoskeletal: Normal range of motion.  Left BKA.  Stump appears well healed.    Lymphadenopathy:    He has no cervical adenopathy.  Neurological: He is alert and oriented to person, place, and time. He exhibits normal muscle tone. Coordination normal.  Skin: Skin is warm and dry. No rash noted.    ED Course  Procedures (including critical care time) Labs Review Labs Reviewed - No data to display  Imaging Review No results found.   EKG Interpretation None      MDM   Final diagnoses:  Medication refill    Pt with multiple health issues and h/x of glaucoma and blindness to both eyes.  Here requesting refill of his pain medication and glaucoma drops.  States he has an appt with a new PMD in October.  He reports pain and pressure to the left eye, but states that is chronic [facility]   He appears stable for d/c    Fanny Agan L. Trisha Mangleriplett, PA-C 05/31/14 1731

## 2014-06-06 NOTE — ED Provider Notes (Signed)
Medical screening examination/treatment/procedure(s) were performed by non-physician practitioner and as supervising physician I was immediately available for consultation/collaboration.   EKG Interpretation None        [name] L Eyden Dobie, MD 06/06/14 0910 

## 2014-12-02 ENCOUNTER — Emergency Department (HOSPITAL_COMMUNITY)
Admission: EM | Admit: 2014-12-02 | Discharge: 2014-12-02 | Disposition: A | Discharge status: Home / Self Care | Payer: Medicare Other | Attending: Emergency Medicine | Admitting: Emergency Medicine

## 2014-12-02 ENCOUNTER — Encounter (HOSPITAL_COMMUNITY): Payer: Self-pay | Admitting: Emergency Medicine

## 2014-12-02 DIAGNOSIS — Z862 Personal history of diseases of the blood and blood-forming organs and certain disorders involving the immune mechanism: Secondary | ICD-10-CM | POA: Diagnosis not present

## 2014-12-02 DIAGNOSIS — Z760 Encounter for issue of repeat prescription: Secondary | ICD-10-CM | POA: Diagnosis not present

## 2014-12-02 DIAGNOSIS — N189 Chronic kidney disease, unspecified: Secondary | ICD-10-CM | POA: Insufficient documentation

## 2014-12-02 DIAGNOSIS — I129 Hypertensive chronic kidney disease with stage 1 through stage 4 chronic kidney disease, or unspecified chronic kidney disease: Secondary | ICD-10-CM | POA: Diagnosis not present

## 2014-12-02 DIAGNOSIS — Z79899 Other long term (current) drug therapy: Secondary | ICD-10-CM | POA: Diagnosis not present

## 2014-12-02 DIAGNOSIS — R011 Cardiac murmur, unspecified: Secondary | ICD-10-CM | POA: Insufficient documentation

## 2014-12-02 DIAGNOSIS — H409 Unspecified glaucoma: Secondary | ICD-10-CM | POA: Diagnosis not present

## 2014-12-02 DIAGNOSIS — E119 Type 2 diabetes mellitus without complications: Secondary | ICD-10-CM | POA: Insufficient documentation

## 2014-12-02 DIAGNOSIS — Z8669 Personal history of other diseases of the nervous system and sense organs: Secondary | ICD-10-CM

## 2014-12-02 DIAGNOSIS — Z794 Long term (current) use of insulin: Secondary | ICD-10-CM | POA: Insufficient documentation

## 2014-12-02 DIAGNOSIS — R000 Tachycardia, unspecified: Secondary | ICD-10-CM | POA: Diagnosis not present

## 2014-12-02 DIAGNOSIS — H540 Blindness, both eyes: Secondary | ICD-10-CM | POA: Insufficient documentation

## 2014-12-02 MED ORDER — BRIMONIDINE TARTRATE 0.2 % OP SOLN: 1.0000 [drp] | Freq: Three times a day (TID) | OPHTHALMIC | Status: AC

## 2014-12-02 MED ORDER — OXYCODONE-ACETAMINOPHEN 5-325 MG PO TABS: 1.0000 | ORAL_TABLET | Freq: Four times a day (QID) | ORAL | Status: DC | PRN

## 2014-12-02 NOTE — ED Provider Notes (Signed)
CSN: [id number]     Arrival date & time 12/02/14  1449 History   First MD Initiated Contact with Patient 12/02/14 1539     Chief Complaint  Patient presents with  . Medication Refill     (Consider location/radiation/quality/duration/timing/severity/associated sxs/prior Treatment) HPI Comments: Patient is a 39 year old male who presents to the emergency department with a requests for medications to be refilled. 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 The patient states that he usually has his eye medication and pain medication refilled by his physicians at [facility]. The patient states that he is unable to see the doctors at [facility] for another 2 weeks. He has already been out of his medication for few weeks.   The history is provided by the patient.    Past Medical History  Diagnosis Date  . Diabetes mellitus   . Hypertension   . Glaucoma (increased eye pressure) 2011  . Microcytic anemia 04/25/2013  . 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 Blind in both eyes    Past Surgical History  Procedure Laterality Date  . Refractive surgery    . Left bka     No family history on file. History  Substance Use Topics  . Smoking status: Never Smoker   . Smokeless tobacco: Never Used  . Alcohol Use: No    Review of Systems  Constitutional: Negative for activity change.       All ROS Neg except as noted in HPI  HENT: Negative.   Eyes: Positive for pain. Negative for photophobia and discharge.  Respiratory: Negative for cough, shortness of breath and wheezing.   Cardiovascular: Negative for chest pain and palpitations.  Gastrointestinal: Negative for abdominal pain and blood in stool.  Genitourinary: Negative for dysuria, frequency and hematuria.  Musculoskeletal: Negative for back pain, arthralgias and neck pain.  Skin: Negative.   Neurological: Negative for  dizziness, seizures and speech difficulty.  Psychiatric/Behavioral: Negative for hallucinations and confusion.      Allergies  Review of patient's allergies indicates no known allergies.  Home Medications   Prior to Admission medications   Medication Sig Start Date End Date Taking? Authorizing Provider  amLODipine (NORVASC) 5 MG tablet Take 5 mg by mouth daily.    Historical Provider, MD  brimonidine (ALPHAGAN) 0.2 % ophthalmic solution Place 1 drop into the left eye 3 (three) times daily. 12/02/14   Kathie Dike, PA-C  furosemide (LASIX) 20 MG tablet Take 20 mg by mouth 2 (two) times daily.    Historical Provider, MD  glyBURIDE (DIABETA) 5 MG tablet Take 5 mg by mouth daily with breakfast.    Historical Provider, MD  insulin aspart (NOVOLOG) 100 UNIT/ML injection Inject 15 Units into the skin 3 (three) times daily with meals. 09/12/13   Avon Gully, MD  insulin glargine (LANTUS) 100 UNIT/ML injection Inject 30 Units into the skin at bedtime.     Historical Provider, MD  linagliptin (TRADJENTA) 5 MG TABS tablet Take 5 mg by mouth daily.    Historical Provider, MD  linagliptin (TRADJENTA) 5 MG TABS tablet Take 1 tablet (5 mg total) by mouth daily. 05/30/14   Tammy L. Triplett, PA-C  lisinopril (PRINIVIL,ZESTRIL) 20 MG tablet Take 20 mg by mouth daily.    Historical Provider, MD  lisinopril (PRINIVIL,ZESTRIL) 20 MG tablet Take 0.5 tablets (10 mg total) by mouth daily. 01/02/14   Benny Lennert,  MD  oxyCODONE-acetaminophen (PERCOCET/ROXICET) 5-325 MG per tablet Take 1 tablet by mouth every 6 (six) hours as needed. 12/02/14   Kathie DikeHobson M Laure Leone, PA-C   BP 181/89 mmHg  Pulse 104  Temp(Src) 97.7 F (36.5 C) (Oral)  Resp 18  Ht 6\' 1"  (1.854 m)  Wt 265 lb (120.203 kg)  BMI 34.97 kg/m2  SpO2 99% Physical Exam  Constitutional: He is oriented to person, place, and time. He appears well-developed and well-nourished.  Non-toxic appearance.  HENT:  Head: Normocephalic.  Right Ear: Tympanic  membrane and external ear normal.  Left Ear: Tympanic membrane and external ear normal.  Eyes: Lids are normal. Right conjunctiva is not injected. Left conjunctiva is injected.  Bilateral blindness.  Neck: Normal range of motion. Neck supple. Carotid bruit is not present.  Cardiovascular: Regular rhythm, intact distal pulses and normal pulses.  Tachycardia present.   Murmur heard. Pulmonary/Chest: Breath sounds normal. No respiratory distress. He has no wheezes. He has no rales.  Abdominal: Soft. Bowel sounds are normal. There is no tenderness. There is no guarding.  Musculoskeletal: Normal range of motion.  Lymphadenopathy:       Head (right side): No submandibular adenopathy present.       Head (left side): No submandibular adenopathy present.    He has no cervical adenopathy.  Neurological: He is alert and oriented to person, place, and time. He has normal strength. No sensory deficit.  Speech clear. Gait steady. Hand to mouth coordination intact.  Skin: Skin is warm and dry.  Psychiatric: He has a normal mood and affect. His speech is normal.  Nursing note and vitals reviewed.   ED Course  Procedures (including critical care time) Labs Review Labs Reviewed - No data to display  Imaging Review No results found.   EKG Interpretation None      MDM Patient states that [place] [facility] [place] to come to the emergency department for refill of his medication. The patient states that he comes here on a fairly regular basis for this refill. He is scheduled to see his doctors in [place] probably in April.  The blood pressure is elevated at 181/89, I have asked the patient to see Dr. Delbert Harnesson Diego for additional evaluation concerning his blood pressure. Prescription for the Brimonidine 0.2% and Percocet given to the patient.    Final diagnoses:  Encounter for medication refill  History of glaucoma    **I have reviewed nursing notes, vital  signs, and all appropriate lab and imaging results for this patient.Kathie Dike*    Kamera Dubas M Deshaun Schou, PA-C 12/02/14 1606  Flint MelterElliott L Wentz, MD 12/03/14 [phone]

## 2014-12-02 NOTE — Discharge Instructions (Signed)
Your blood pressure is elevated at 181/89. Please have this rechecked by Dr Janna Archondiego. See your eye specialist as soon as possible for additional evaluation.

## 2014-12-02 NOTE — ED Notes (Signed)
PT requesting eye drop and pain medication refill d/t [facility] until April 2016.

## 2015-01-18 ENCOUNTER — Encounter (HOSPITAL_COMMUNITY): Payer: Self-pay

## 2015-01-18 ENCOUNTER — Inpatient Hospital Stay (HOSPITAL_COMMUNITY)
Admission: EM | Admit: 2015-01-18 | Discharge: 2015-01-24 | DRG: 682 | Disposition: A | Discharge status: Home / Self Care | Payer: Medicare Other | Attending: Family Medicine | Admitting: Family Medicine

## 2015-01-18 ENCOUNTER — Emergency Department (HOSPITAL_COMMUNITY): Payer: Medicare Other

## 2015-01-18 DIAGNOSIS — E1151 Type 2 diabetes mellitus with diabetic peripheral angiopathy without gangrene: Secondary | ICD-10-CM | POA: Diagnosis present

## 2015-01-18 DIAGNOSIS — Z79899 Other long term (current) drug therapy: Secondary | ICD-10-CM

## 2015-01-18 DIAGNOSIS — D509 Iron deficiency anemia, unspecified: Secondary | ICD-10-CM

## 2015-01-18 DIAGNOSIS — D890 Polyclonal hypergammaglobulinemia: Secondary | ICD-10-CM | POA: Diagnosis present

## 2015-01-18 DIAGNOSIS — N170 Acute kidney failure with tubular necrosis: Secondary | ICD-10-CM | POA: Diagnosis present

## 2015-01-18 DIAGNOSIS — D631 Anemia in chronic kidney disease: Secondary | ICD-10-CM | POA: Diagnosis present

## 2015-01-18 DIAGNOSIS — R0600 Dyspnea, unspecified: Secondary | ICD-10-CM | POA: Diagnosis not present

## 2015-01-18 DIAGNOSIS — N186 End stage renal disease: Secondary | ICD-10-CM

## 2015-01-18 DIAGNOSIS — Z89512 Acquired absence of left leg below knee: Secondary | ICD-10-CM

## 2015-01-18 DIAGNOSIS — Z794 Long term (current) use of insulin: Secondary | ICD-10-CM | POA: Diagnosis not present

## 2015-01-18 DIAGNOSIS — E11319 Type 2 diabetes mellitus with unspecified diabetic retinopathy without macular edema: Secondary | ICD-10-CM | POA: Diagnosis present

## 2015-01-18 DIAGNOSIS — I10 Essential (primary) hypertension: Secondary | ICD-10-CM | POA: Diagnosis present

## 2015-01-18 DIAGNOSIS — N184 Chronic kidney disease, stage 4 (severe): Secondary | ICD-10-CM | POA: Diagnosis present

## 2015-01-18 DIAGNOSIS — Z9114 Patient's other noncompliance with medication regimen: Secondary | ICD-10-CM | POA: Diagnosis present

## 2015-01-18 DIAGNOSIS — J189 Pneumonia, unspecified organism: Secondary | ICD-10-CM | POA: Diagnosis present

## 2015-01-18 DIAGNOSIS — H540 Blindness, both eyes: Secondary | ICD-10-CM | POA: Diagnosis present

## 2015-01-18 DIAGNOSIS — Z992 Dependence on renal dialysis: Secondary | ICD-10-CM | POA: Diagnosis not present

## 2015-01-18 DIAGNOSIS — N19 Unspecified kidney failure: Secondary | ICD-10-CM

## 2015-01-18 DIAGNOSIS — E1165 Type 2 diabetes mellitus with hyperglycemia: Secondary | ICD-10-CM | POA: Diagnosis present

## 2015-01-18 DIAGNOSIS — N179 Acute kidney failure, unspecified: Secondary | ICD-10-CM | POA: Diagnosis present

## 2015-01-18 DIAGNOSIS — H409 Unspecified glaucoma: Secondary | ICD-10-CM | POA: Diagnosis present

## 2015-01-18 DIAGNOSIS — R918 Other nonspecific abnormal finding of lung field: Secondary | ICD-10-CM | POA: Diagnosis present

## 2015-01-18 DIAGNOSIS — E0829 Diabetes mellitus due to underlying condition with other diabetic kidney complication: Secondary | ICD-10-CM

## 2015-01-18 DIAGNOSIS — N189 Chronic kidney disease, unspecified: Secondary | ICD-10-CM

## 2015-01-18 DIAGNOSIS — E871 Hypo-osmolality and hyponatremia: Secondary | ICD-10-CM | POA: Diagnosis not present

## 2015-01-18 DIAGNOSIS — I129 Hypertensive chronic kidney disease with stage 1 through stage 4 chronic kidney disease, or unspecified chronic kidney disease: Secondary | ICD-10-CM | POA: Diagnosis present

## 2015-01-18 DIAGNOSIS — E119 Type 2 diabetes mellitus without complications: Secondary | ICD-10-CM

## 2015-01-18 DIAGNOSIS — E1122 Type 2 diabetes mellitus with diabetic chronic kidney disease: Secondary | ICD-10-CM | POA: Diagnosis present

## 2015-01-18 DIAGNOSIS — D72829 Elevated white blood cell count, unspecified: Secondary | ICD-10-CM | POA: Diagnosis present

## 2015-01-18 LAB — CBC WITH DIFFERENTIAL/PLATELET
Basophils Absolute: 0 10*3/uL (ref 0.0–0.1)
Basophils Relative: 0 % (ref 0–1)
Eosinophils Absolute: 0.2 10*3/uL (ref 0.0–0.7)
Eosinophils Relative: 1 % (ref 0–5)
HEMATOCRIT: 21.4 % — AB (ref 39.0–52.0)
HEMOGLOBIN: 7 g/dL — AB (ref 13.0–17.0)
Lymphocytes Relative: 9 % — ABNORMAL LOW (ref 12–46)
Lymphs Abs: 1.1 10*3/uL (ref 0.7–4.0)
MCH: 24.9 pg — ABNORMAL LOW (ref 26.0–34.0)
MCHC: 32.7 g/dL (ref 30.0–36.0)
MCV: 76.2 fL — AB (ref 78.0–100.0)
MONOS PCT: 16 % — AB (ref 3–12)
Monocytes Absolute: 1.9 10*3/uL — ABNORMAL HIGH (ref 0.1–1.0)
NEUTROS ABS: 8.9 10*3/uL — AB (ref 1.7–7.7)
NEUTROS PCT: 74 % (ref 43–77)
PLATELETS: 186 10*3/uL (ref 150–400)
RBC: 2.81 MIL/uL — ABNORMAL LOW (ref 4.22–5.81)
RDW: 15.2 % (ref 11.5–15.5)
WBC: 12.1 10*3/uL — AB (ref 4.0–10.5)

## 2015-01-18 LAB — BASIC METABOLIC PANEL
ANION GAP: 17 — AB (ref 5–15)
BUN: 75 mg/dL — AB (ref 6–23)
CO2: 26 mmol/L (ref 19–32)
Calcium: 6.5 mg/dL — ABNORMAL LOW (ref 8.4–10.5)
Chloride: 94 mmol/L — ABNORMAL LOW (ref 96–112)
Creatinine, Ser: 11.23 mg/dL — ABNORMAL HIGH (ref 0.50–1.35)
GFR calc Af Amer: 6 mL/min — ABNORMAL LOW (ref >90)
GFR calc non Af Amer: 5 mL/min — ABNORMAL LOW (ref >90)
Glucose, Bld: 166 mg/dL — ABNORMAL HIGH (ref 70–99)
POTASSIUM: 4.2 mmol/L (ref 3.5–5.1)
Sodium: 137 mmol/L (ref 135–145)

## 2015-01-18 LAB — HEPATIC FUNCTION PANEL
ALBUMIN: 2.9 g/dL — AB (ref 3.5–5.2)
ALK PHOS: 65 U/L (ref 39–117)
ALT: 25 U/L (ref 0–53)
AST: 31 U/L (ref 0–37)
Bilirubin, Direct: 0.1 mg/dL (ref 0.0–0.5)
Indirect Bilirubin: 0.5 mg/dL (ref 0.3–0.9)
Total Bilirubin: 0.6 mg/dL (ref 0.3–1.2)
Total Protein: 7.7 g/dL (ref 6.0–8.3)

## 2015-01-18 LAB — TROPONIN I
TROPONIN I: 0.09 ng/mL — AB (ref <0.031)
Troponin I: 0.09 ng/mL — ABNORMAL HIGH (ref <0.031)

## 2015-01-18 MED ORDER — SODIUM CHLORIDE 0.9 % IJ SOLN
3.0000 mL | Freq: Two times a day (BID) | INTRAMUSCULAR | Status: DC
Start: 2015-01-18 — End: 2015-01-24
  Administered 2015-01-19 – 2015-01-23 (×8): 3 mL via INTRAVENOUS

## 2015-01-18 MED ORDER — NITROGLYCERIN 2 % TD OINT
0.5000 [in_us] | TOPICAL_OINTMENT | Freq: Four times a day (QID) | TRANSDERMAL | Status: DC
  Administered 2015-01-19 – 2015-01-24 (×22): 0.5 [in_us] via TOPICAL
  Filled 2015-01-18 (×22): qty 1

## 2015-01-18 MED ORDER — HEPARIN SODIUM (PORCINE) 5000 UNIT/ML IJ SOLN
5000.0000 [IU] | Freq: Three times a day (TID) | INTRAMUSCULAR | Status: DC
  Administered 2015-01-19 – 2015-01-20 (×6): 5000 [IU] via SUBCUTANEOUS
  Filled 2015-01-18 (×6): qty 1

## 2015-01-18 MED ORDER — SODIUM CHLORIDE 0.9 % IV SOLN: 250.0000 mL | INTRAVENOUS | Status: DC | PRN

## 2015-01-18 MED ORDER — IPRATROPIUM-ALBUTEROL 0.5-2.5 (3) MG/3ML IN SOLN
RESPIRATORY_TRACT | Status: AC
  Administered 2015-01-18: 3 mL
  Filled 2015-01-18: qty 3

## 2015-01-18 MED ORDER — INSULIN GLARGINE 100 UNIT/ML ~~LOC~~ SOLN
20.0000 [IU] | Freq: Every day | SUBCUTANEOUS | Status: DC
Start: 2015-01-18 — End: 2015-01-24
  Administered 2015-01-19 – 2015-01-23 (×6): 20 [IU] via SUBCUTANEOUS
  Filled 2015-01-18 (×7): qty 0.2

## 2015-01-18 MED ORDER — IPRATROPIUM-ALBUTEROL 0.5-2.5 (3) MG/3ML IN SOLN
3.0000 mL | Freq: Once | RESPIRATORY_TRACT | Status: AC
  Administered 2015-01-18: 3 mL via RESPIRATORY_TRACT
  Filled 2015-01-18: qty 3

## 2015-01-18 MED ORDER — BRIMONIDINE TARTRATE 0.2 % OP SOLN
1.0000 [drp] | Freq: Three times a day (TID) | OPHTHALMIC | Status: DC
  Administered 2015-01-19 – 2015-01-24 (×16): 1 [drp] via OPHTHALMIC
  Filled 2015-01-18: qty 5

## 2015-01-18 MED ORDER — ALBUTEROL SULFATE (2.5 MG/3ML) 0.083% IN NEBU
INHALATION_SOLUTION | RESPIRATORY_TRACT | Status: AC
  Administered 2015-01-18: 2.5 mg
  Filled 2015-01-18: qty 3

## 2015-01-18 MED ORDER — ALBUTEROL SULFATE (2.5 MG/3ML) 0.083% IN NEBU
2.5000 mg | INHALATION_SOLUTION | Freq: Once | RESPIRATORY_TRACT | Status: AC
Start: 2015-01-18 — End: 2015-01-18
  Administered 2015-01-18: 2.5 mg via RESPIRATORY_TRACT
  Filled 2015-01-18: qty 3

## 2015-01-18 MED ORDER — LEVOFLOXACIN IN D5W 250 MG/50ML IV SOLN
250.0000 mg | Freq: Once | INTRAVENOUS | Status: AC
  Administered 2015-01-19: 250 mg via INTRAVENOUS

## 2015-01-18 MED ORDER — SODIUM CHLORIDE 0.9 % IJ SOLN: 3.0000 mL | INTRAMUSCULAR | Status: DC | PRN

## 2015-01-18 MED ORDER — FUROSEMIDE 10 MG/ML IJ SOLN
120.0000 mg | Freq: Once | INTRAVENOUS | Status: AC
  Administered 2015-01-19: 120 mg via INTRAVENOUS
  Filled 2015-01-18: qty 12

## 2015-01-18 MED ORDER — AMLODIPINE BESYLATE 5 MG PO TABS
5.0000 mg | ORAL_TABLET | Freq: Every day | ORAL | Status: DC
  Administered 2015-01-19 – 2015-01-24 (×5): 5 mg via ORAL
  Filled 2015-01-18 (×5): qty 1

## 2015-01-18 MED ORDER — LEVOFLOXACIN IN D5W 500 MG/100ML IV SOLN: 500.0000 mg | INTRAVENOUS | Status: DC

## 2015-01-18 MED ORDER — SODIUM CHLORIDE 0.9 % IV BOLUS (SEPSIS)
1000.0000 mL | Freq: Once | INTRAVENOUS | Status: AC
  Administered 2015-01-18: 1000 mL via INTRAVENOUS

## 2015-01-18 MED ORDER — INSULIN ASPART 100 UNIT/ML ~~LOC~~ SOLN
3.0000 [IU] | Freq: Three times a day (TID) | SUBCUTANEOUS | Status: DC
Start: 2015-01-19 — End: 2015-01-24
  Administered 2015-01-19 – 2015-01-24 (×14): 3 [IU] via SUBCUTANEOUS

## 2015-01-18 MED ORDER — LEVOFLOXACIN 500 MG PO TABS
500.0000 mg | ORAL_TABLET | Freq: Once | ORAL | Status: AC
  Administered 2015-01-18: 500 mg via ORAL
  Filled 2015-01-18: qty 1

## 2015-01-18 MED ORDER — SODIUM CHLORIDE 0.9 % IJ SOLN
3.0000 mL | Freq: Two times a day (BID) | INTRAMUSCULAR | Status: DC
  Administered 2015-01-19 – 2015-01-21 (×4): 3 mL via INTRAVENOUS

## 2015-01-18 MED ORDER — INSULIN ASPART 100 UNIT/ML ~~LOC~~ SOLN
0.0000 [IU] | Freq: Three times a day (TID) | SUBCUTANEOUS | Status: DC
Start: 2015-01-19 — End: 2015-01-24
  Administered 2015-01-19 (×2): 2 [IU] via SUBCUTANEOUS
  Administered 2015-01-19 – 2015-01-20 (×2): 1 [IU] via SUBCUTANEOUS
  Administered 2015-01-20 – 2015-01-21 (×2): 2 [IU] via SUBCUTANEOUS
  Administered 2015-01-22: 5 [IU] via SUBCUTANEOUS
  Administered 2015-01-22 (×2): 2 [IU] via SUBCUTANEOUS
  Administered 2015-01-23: 3 [IU] via SUBCUTANEOUS
  Administered 2015-01-23: 7 [IU] via SUBCUTANEOUS
  Administered 2015-01-23 – 2015-01-24 (×2): 2 [IU] via SUBCUTANEOUS
  Administered 2015-01-24: 5 [IU] via SUBCUTANEOUS

## 2015-01-18 NOTE — ED Notes (Signed)
Attempted IV access x1 without success. Venetia NightSharon Moore, RN at bedside attempting access.

## 2015-01-18 NOTE — ED Notes (Signed)
Pt. Placed on 3L [place]. O2 sat now 95%.

## 2015-01-18 NOTE — ED Notes (Signed)
Patient states that he has been coughing and had some shortness of breath, was running a fever of 101.0. States that he has had some chest pain and arm pain off and on.

## 2015-01-18 NOTE — ED Notes (Signed)
Pt. Received 500 mL of NS. Dr. Estell HarpinZammit verbal order to stop fluids.

## 2015-01-18 NOTE — ED Provider Notes (Signed)
CSN: [id number]     Arrival date & time 01/18/15  1907 History   First MD Initiated Contact with Patient 01/18/15 1951     Chief Complaint  Patient presents with  . Cough     (Consider location/radiation/quality/duration/timing/severity/associated sxs/prior Treatment) Patient is a 39 y.o. male presenting with cough. The history is provided by the patient (pt complains of cough and fever).  Cough Cough characteristics:  Non-productive Severity:  Moderate Onset quality:  Sudden Timing:  Constant Chronicity:  New Associated symptoms: no chest pain, no eye discharge, no headaches and no rash     Past Medical History  Diagnosis Date  . Diabetes mellitus   . Hypertension   . Glaucoma (increased eye pressure) 2011  . Microcytic anemia 04/25/2013  . 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 Blind in both eyes    Past Surgical History  Procedure Laterality Date  . Refractive surgery    . Left bka     No family history on file. History  Substance Use Topics  . Smoking status: Never Smoker   . Smokeless tobacco: Never Used  . Alcohol Use: No    Review of Systems  Constitutional: Negative for appetite change and fatigue.  HENT: Negative for congestion, ear discharge and sinus pressure.   Eyes: Negative for discharge.  Respiratory: Positive for cough.   Cardiovascular: Negative for chest pain.  Gastrointestinal: Negative for abdominal pain and diarrhea.  Genitourinary: Negative for frequency and hematuria.  Musculoskeletal: Negative for back pain.  Skin: Negative for rash.  Neurological: Negative for seizures and headaches.  Psychiatric/Behavioral: Negative for hallucinations.      Allergies  Review of patient's allergies indicates no known allergies.  Home Medications   Prior to Admission medications   Medication Sig Start Date End Date Taking? Authorizing Provider  amLODipine (NORVASC) 5 MG tablet Take 5 mg by mouth daily.   Yes Historical Provider, MD  brimonidine  (ALPHAGAN) 0.2 % ophthalmic solution Place 1 drop into the left eye 3 (three) times daily. 12/02/14  Yes Ivery Quale, PA-C  insulin aspart (NOVOLOG) 100 UNIT/ML injection Inject 15 Units into the skin 3 (three) times daily with meals. 09/12/13  Yes Avon Gully, MD  insulin glargine (LANTUS) 100 UNIT/ML injection Inject 30 Units into the skin at bedtime.    Yes Historical Provider, MD  linagliptin (TRADJENTA) 5 MG TABS tablet Take 1 tablet (5 mg total) by mouth daily. 05/30/14  Yes Tammi Triplett, PA-C  oxyCODONE-acetaminophen (PERCOCET/ROXICET) 5-325 MG per tablet Take 1 tablet by mouth every 6 (six) hours as needed. Patient not taking: Reported on 01/18/2015 12/02/14   Ivery Quale, PA-C   BP 166/87 mmHg  Pulse 115  Temp(Src) 98.1 F (36.7 C) (Oral)  Resp 25  Ht  (1.854 m)  Wt 310 lb (140.615 kg)  BMI 40.91 kg/m2  SpO2 95% Physical Exam  Constitutional: He is oriented to person, place, and time. He appears well-developed.  HENT:  Head: Normocephalic.  Eyes: Conjunctivae and EOM are normal. No scleral icterus.  Neck: Neck supple. No thyromegaly present.  Cardiovascular: Normal rate and regular rhythm.  Exam reveals no gallop and no friction rub.   No murmur heard. Pulmonary/Chest: No stridor. He has no wheezes. He has rales. He exhibits no tenderness.  Abdominal: He exhibits no distension. There is no tenderness. There is no rebound.  Musculoskeletal: Normal range of motion. He exhibits no edema.  Lymphadenopathy:    He has no cervical adenopathy.  Neurological: He is oriented to person,  place, and time. He exhibits normal muscle tone. Coordination normal.  Skin: No rash noted. No erythema.  Psychiatric: He has a normal mood and affect. His behavior is normal.    ED Course  Procedures (including critical care time) Labs Review Labs Reviewed  CBC WITH DIFFERENTIAL/PLATELET - Abnormal; Notable for the following:    WBC 12.1 (*)    RBC 2.81 (*)    Hemoglobin 7.0 (*)    HCT  21.4 (*)    MCV 76.2 (*)    MCH 24.9 (*)    Neutro Abs 8.9 (*)    Lymphocytes Relative 9 (*)    Monocytes Relative 16 (*)    Monocytes Absolute 1.9 (*)    All other components within normal limits  BASIC METABOLIC PANEL - Abnormal; Notable for the following:    Chloride 94 (*)    Glucose, Bld 166 (*)    BUN 75 (*)    Creatinine, Ser 11.23 (*)    Calcium 6.5 (*)    GFR calc non Af Amer 5 (*)    GFR calc Af Amer 6 (*)    Anion gap 17 (*)    All other components within normal limits  BRAIN NATRIURETIC PEPTIDE  TROPONIN I  HEPATIC FUNCTION PANEL    Imaging Review Dg Chest 2 View  01/18/2015   CLINICAL DATA:  Nonproductive cough. Fever and shortness of breath. Chest pain.  EXAM: CHEST  2 VIEW  COMPARISON:  Chest x-rays dated 09/17/2013 and 10/16/2012  FINDINGS: The patient has patchy infiltrates in both lower lobes. There is chronic lateral pleural thickening on the right. Heart size and pulmonary vascularity are normal.  On the lateral view there is increased density at the inferior aspect of the hilar regions which may be a summation shadow of the pulmonary infiltrates but follow-up is recommended.  No effusions.  No acute osseous abnormality.  IMPRESSION: New bilateral infiltrates at the lung bases. Follow-up is recommended to ensure clearing.   Electronically Signed   By: Francene BoyersJames  Maxwell M.D.   On: 01/18/2015 21:05     EKG Interpretation   Date/Time:  Thursday January 18 2015 22:01:56 EDT Ventricular Rate:  112 PR Interval:  136 QRS Duration: 88 QT Interval:  361 QTC Calculation: 493 R Axis:   11 Text Interpretation:  Sinus tachycardia Ventricular premature complex  Minimal ST depression, lateral leads Borderline prolonged QT interval  Baseline wander in lead(s) I II III aVR aVL V1 V3 V5 V6 Confirmed by  Romulus Hanrahan  MD, Kayliah Tindol [phone]) on 01/18/2015 10:22:49 PM     CRITICAL CARE Performed by: Yash Cacciola L Total critical care time: 35 Critical care time was exclusive of  separately billable procedures and treating other patients. Critical care was necessary to treat or prevent imminent or life-threatening deterioration. Critical care was time spent personally by me on the following activities: development of treatment plan with patient and/or surrogate as well as nursing, discussions with consultants, evaluation of patient's response to treatment, examination of patient, obtaining history from patient or surrogate, ordering and performing treatments and interventions, ordering and review of laboratory studies, ordering and review of radiographic studies, pulse oximetry and re-evaluation of patient's condition.  MDM   Final diagnoses:  Renal failure    Admit with renal failure,  [place],  Renal to consult in am  .    Bethann BerkshireJoseph Cadence Minton, MD 01/18/15 2223

## 2015-01-18 NOTE — Progress Notes (Signed)
ANTIBIOTIC CONSULT NOTE  Pharmacy Consult for Levaquin Indication: pneumonia  No Known Allergies  Patient Measurements: Height: 6\' 1"  (185.4 cm) Weight: (!) 310 lb (140.615 kg) IBW/kg (Calculated) : 79.9  Vital Signs: Temp: 98.6 F (37 C) (03/24 2230) Temp Source: Oral (03/24 2230) BP: 160/70 mmHg (03/24 2230) Pulse Rate: 113 (03/24 2230) Intake/Output from previous day:   Intake/Output from this shift:    Labs:  Recent Labs  01/18/15 2110 01/18/15 2145  WBC  --  12.1*  HGB  --  7.0*  PLT  --  186  CREATININE 11.23*  --    Estimated Creatinine Clearance: 13.3 mL/min (by C-G formula based on Cr of 11.23). No results for input(s): VANCOTROUGH, VANCOPEAK, VANCORANDOM, GENTTROUGH, GENTPEAK, GENTRANDOM, TOBRATROUGH, TOBRAPEAK, TOBRARND, AMIKACINPEAK, AMIKACINTROU, AMIKACIN in the last 72 hours.   Microbiology: No results found for this or any previous visit (from the past 720 hour(s)).  Anti-infectives    Start     Dose/Rate Route Frequency Ordered Stop   01/20/15 2200  levofloxacin (LEVAQUIN) IVPB 500 mg     500 mg 100 mL/hr over 60 Minutes Intravenous Every 48 hours 01/18/15 2322     01/18/15 2330  Levofloxacin (LEVAQUIN) IVPB 250 mg     250 mg 50 mL/hr over 60 Minutes Intravenous  Once 01/18/15 2321     01/18/15 2130  levofloxacin (LEVAQUIN) tablet 500 mg     500 mg Oral  Once 01/18/15 2121 01/18/15 2128      Assessment: 39 yo obese M who presented with cough, shortness of breath, and reported fever.   He is afebrile since admission with mild leukocytosis. CXR + for infiltrates and empiric antibiotics were started for CAP.  Acute on chronic renal failure noted.  Estimated CrCl~6110ml/min. He has already received Levaquin 500mg  IV x1 in ED.   Levaquin 3/24>>  Goal of Therapy:  Eradicate infection.  Plan:  Given additional Levaquin 250mg  IV x1 now for total loading dose =750mg , followed by Levaquin 500mg  IV q48h Change to po once clinically  appropriate Monitor renal function, cx data, patient progress Duration of therapy per MD  Elson ClanLilliston, Jamelyn Bovard Michelle 01/18/2015,11:24 PM

## 2015-01-18 NOTE — H&P (Addendum)
[facility] History and Physical  JEROME VIGLIONE WUJ:[id number] DOB: 03-13-1976 DOA: 01/18/2015  Referring physician: EDP PCP: Isabella Stalling, MD   Chief Complaint: Cough   HPI: Donald Sampson is a 39 y.o. male who presents to the ED with 2 day history of cough, SOB, single episode of fever of 101 at home.  Cough is non-productive, there is definite associated orthopnea with his SOB.  Patient reports that he has been making a good amount of urine over the past couple of days as well and has no trouble urinating.  No back nor flank pain.  Review of Systems: Systems reviewed.  As above, otherwise negative  Past Medical History  Diagnosis Date  . Diabetes mellitus   . Hypertension   . Glaucoma (increased eye pressure) 2011  . Microcytic anemia 04/25/2013  . 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 Blind in both eyes    Past Surgical History  Procedure Laterality Date  . Refractive surgery    . Left bka     Social History:  reports that he has never smoked. He has never used smokeless tobacco. He reports that he does not drink alcohol or use illicit drugs.  No Known Allergies  No family history on file.   Prior to Admission medications   Medication Sig Start Date End Date Taking? Authorizing Provider  amLODipine (NORVASC) 5 MG tablet Take 5 mg by mouth daily.   Yes Historical Provider, MD  brimonidine (ALPHAGAN) 0.2 % ophthalmic solution Place 1 drop into the left eye 3 (three) times daily. 12/02/14  Yes Ivery Quale, PA-C  insulin aspart (NOVOLOG) 100 UNIT/ML injection Inject 15 Units into the skin 3 (three) times daily with meals. 09/12/13  Yes Avon Gully, MD  insulin glargine (LANTUS) 100 UNIT/ML injection Inject 30 Units into the skin at bedtime.    Yes Historical Provider, MD  linagliptin (TRADJENTA) 5 MG TABS tablet Take 1 tablet (5 mg total) by mouth daily. 05/30/14  Yes Tammi Triplett, PA-C   Physical Exam: Filed Vitals:   01/18/15 2155  BP: 166/87  Pulse:  115  Temp: 98.1 F (36.7 C)  Resp: 25    BP 166/87 mmHg  Pulse 115  Temp(Src) 98.1 F (36.7 C) (Oral)  Resp 25  Ht  (1.854 m)  Wt 140.615 kg (310 lb)  BMI 40.91 kg/m2  SpO2 95%  General Appearance:    Alert, oriented, mild respiratory distress, patient sitting at a 90 degree angle, appears stated age  Head:    Normocephalic, atraumatic  Eyes:    PERRL, EOMI, sclera non-icteric        Nose:   Nares without drainage or epistaxis. Mucosa, turbinates normal  Throat:   Moist mucous membranes. Oropharynx without erythema or exudate.  Neck:   Supple. No carotid bruits.  No thyromegaly.  No lymphadenopathy.   Back:     No CVA tenderness, no spinal tenderness  Lungs:     Rales in B lungs.  Chest wall:    No tenderness to palpitation  Heart:    Regular rate and rhythm without murmurs, gallops, rubs  Abdomen:     Soft, non-tender, nondistended, normal bowel sounds, no organomegaly  Genitalia:    deferred  Rectal:    deferred  Extremities:   3+ pitting edema in RLE, LLE s/p amputation  Pulses:   2+ and symmetric all extremities  Skin:   Skin color, texture, turgor normal, no rashes or lesions  Lymph nodes:   Cervical, supraclavicular, and  axillary nodes normal  Neurologic:   CNII-XII intact. Normal strength, sensation and reflexes      throughout    Labs on Admission:  Basic Metabolic Panel:  Recent Labs Lab 01/18/15 2110  NA 137  K 4.2  CL 94*  CO2 26  GLUCOSE 166*  BUN 75*  CREATININE 11.23*  CALCIUM 6.5*   Liver Function Tests:  Recent Labs Lab 01/18/15 2115  AST 31  ALT 25  ALKPHOS 65  BILITOT 0.6  PROT 7.7  ALBUMIN 2.9*   No results for input(s): LIPASE, AMYLASE in the last 168 hours. No results for input(s): AMMONIA in the last 168 hours. CBC:  Recent Labs Lab 01/18/15 2145  WBC 12.1*  NEUTROABS 8.9*  HGB 7.0*  HCT 21.4*  MCV 76.2*  PLT 186   Cardiac Enzymes:  Recent Labs Lab 01/18/15 2115  TROPONINI 0.09*    BNP (last 3  results) No results for input(s): PROBNP in the last 8760 hours. CBG: No results for input(s): GLUCAP in the last 168 hours.  Radiological Exams on Admission: Dg Chest 2 View  01/18/2015   CLINICAL DATA:  Nonproductive cough. Fever and shortness of breath. Chest pain.  EXAM: CHEST  2 VIEW  COMPARISON:  Chest x-rays dated 09/17/2013 and 10/16/2012  FINDINGS: The patient has patchy infiltrates in both lower lobes. There is chronic lateral pleural thickening on the right. Heart size and pulmonary vascularity are normal.  On the lateral view there is increased density at the inferior aspect of the hilar regions which may be a summation shadow of the pulmonary infiltrates but follow-up is recommended.  No effusions.  No acute osseous abnormality.  IMPRESSION: New bilateral infiltrates at the lung bases. Follow-up is recommended to ensure clearing.   Electronically Signed   By: Francene BoyersJames  Maxwell M.D.   On: 01/18/2015 21:05    EKG: Independently reviewed.  Assessment/Plan Principal Problem:   Acute renal failure superimposed on stage 4 chronic kidney disease Active Problems:   Hypertension   Diabetes   Leukocytosis   Bilateral pulmonary infiltrates on chest x-ray   1. Acute renal failure superimposed on CKD stage 4 - 1. Will go ahead and treat patient for CAP given the history of fever; however, I am more suspicious that his cough, bibasilar infiltrates, orthopnea, in setting of what is clearly not pre-renal failure are a result of fluid overload in the setting of kidney failure as opposed to a CAP. 2. Levaquin per pharm consult for possible CAP 3. Have consulted nephrology who will evaluate in AM, also requests: 1. Lasix 120mg  IV x1 2. Foley catheter 4. Strict intake and output 5. Serial troponins given the mild elevation of 0.09 initially, I doubt that this represents ACS however, and am much more suspicious of acute CHF due to fluid retention. 6. BNP ordered and pending 7. Repeat CBC and BMP  in AM 8. Renal US ordered 2. Leukocytosis - 1. Appears to be somewhat chronic but could be due to CAP 2. Repeat CBC in AM 3. HTN - 1. Continue norvasc 2. Add NTG paste 3. Lasix 4. DM 1. Patient is on lantus 30 and novolog 15 TID at home 2. Given AKF, will reduce this to lantus 20 and novolog 3 TID AC, will also add low dose SSI AC/HS    Code Status: Full Code  Family Communication: Family at bedside Disposition Plan: Admit to inpatient   Time spent: 70 min  Hue Steveson M. [facility] Pager [phone]  If 7AM-7PM,  please contact the day team taking care of the patient [email or web address] Password [facility] 01/18/2015, 10:37 PM

## 2015-01-19 ENCOUNTER — Inpatient Hospital Stay (HOSPITAL_COMMUNITY): Payer: Medicare Other

## 2015-01-19 DIAGNOSIS — R0600 Dyspnea, unspecified: Secondary | ICD-10-CM

## 2015-01-19 LAB — BASIC METABOLIC PANEL
Anion gap: 14 (ref 5–15)
BUN: 78 mg/dL — ABNORMAL HIGH (ref 6–23)
CALCIUM: 6.2 mg/dL — AB (ref 8.4–10.5)
CO2: 26 mmol/L (ref 19–32)
Chloride: 93 mmol/L — ABNORMAL LOW (ref 96–112)
Creatinine, Ser: 10.87 mg/dL — ABNORMAL HIGH (ref 0.50–1.35)
GFR calc Af Amer: 6 mL/min — ABNORMAL LOW (ref >90)
GFR calc non Af Amer: 5 mL/min — ABNORMAL LOW (ref >90)
GLUCOSE: 275 mg/dL — AB (ref 70–99)
POTASSIUM: 4.2 mmol/L (ref 3.5–5.1)
Sodium: 133 mmol/L — ABNORMAL LOW (ref 135–145)

## 2015-01-19 LAB — CBC
HCT: 19.1 % — ABNORMAL LOW (ref 39.0–52.0)
HCT: 18.8 % — ABNORMAL LOW (ref 39.0–52.0)
HCT: 20 % — ABNORMAL LOW (ref 39.0–52.0)
HEMOGLOBIN: 6.1 g/dL — AB (ref 13.0–17.0)
Hemoglobin: 6.1 g/dL — CL (ref 13.0–17.0)
Hemoglobin: 6.6 g/dL — CL (ref 13.0–17.0)
MCH: 24.4 pg — ABNORMAL LOW (ref 26.0–34.0)
MCH: 24.7 pg — ABNORMAL LOW (ref 26.0–34.0)
MCH: 25.2 pg — ABNORMAL LOW (ref 26.0–34.0)
MCHC: 31.9 g/dL (ref 30.0–36.0)
MCHC: 32.4 g/dL (ref 30.0–36.0)
MCHC: 33 g/dL (ref 30.0–36.0)
MCV: 76.4 fL — ABNORMAL LOW (ref 78.0–100.0)
MCV: 76.1 fL — ABNORMAL LOW (ref 78.0–100.0)
MCV: 76.3 fL — ABNORMAL LOW (ref 78.0–100.0)
PLATELETS: 231 10*3/uL (ref 150–400)
Platelets: 193 10*3/uL (ref 150–400)
Platelets: 178 10*3/uL (ref 150–400)
RBC: 2.5 MIL/uL — ABNORMAL LOW (ref 4.22–5.81)
RBC: 2.47 MIL/uL — AB (ref 4.22–5.81)
RBC: 2.62 MIL/uL — AB (ref 4.22–5.81)
RDW: 15.3 % (ref 11.5–15.5)
RDW: 15.2 % (ref 11.5–15.5)
RDW: 15.2 % (ref 11.5–15.5)
WBC: 9.8 10*3/uL (ref 4.0–10.5)
WBC: 9.9 10*3/uL (ref 4.0–10.5)
WBC: 11.6 10*3/uL — AB (ref 4.0–10.5)

## 2015-01-19 LAB — GLUCOSE, CAPILLARY
Glucose-Capillary: 185 mg/dL — ABNORMAL HIGH (ref 70–99)
Glucose-Capillary: 193 mg/dL — ABNORMAL HIGH (ref 70–99)
Glucose-Capillary: 156 mg/dL — ABNORMAL HIGH (ref 70–99)
Glucose-Capillary: 132 mg/dL — ABNORMAL HIGH (ref 70–99)
Glucose-Capillary: 185 mg/dL — ABNORMAL HIGH (ref 70–99)

## 2015-01-19 LAB — MRSA PCR SCREENING: MRSA by PCR: NEGATIVE

## 2015-01-19 LAB — RETICULOCYTES
RBC.: 2.62 MIL/uL — ABNORMAL LOW (ref 4.22–5.81)
Retic Count, Absolute: 39.3 K/uL (ref 19.0–186.0)
Retic Ct Pct: 1.5 % (ref 0.4–3.1)

## 2015-01-19 LAB — BRAIN NATRIURETIC PEPTIDE: B Natriuretic Peptide: 100 pg/mL (ref 0.0–100.0)

## 2015-01-19 LAB — TROPONIN I
TROPONIN I: 0.14 ng/mL — AB (ref <0.031)
Troponin I: 0.1 ng/mL — ABNORMAL HIGH (ref <0.031)

## 2015-01-19 LAB — PHOSPHORUS: Phosphorus: 8.2 mg/dL — ABNORMAL HIGH (ref 2.3–4.6)

## 2015-01-19 LAB — TSH: TSH: 1.929 u[IU]/mL (ref 0.350–4.500)

## 2015-01-19 LAB — SEDIMENTATION RATE: Sed Rate: >140 mm/h — ABNORMAL HIGH (ref 0–16)

## 2015-01-19 MED ORDER — DEXTROSE 5 % IV SOLN
160.0000 mg | Freq: Two times a day (BID) | INTRAVENOUS | Status: DC
  Administered 2015-01-19 – 2015-01-24 (×8): 160 mg via INTRAVENOUS
  Filled 2015-01-19 (×13): qty 16

## 2015-01-19 MED ORDER — BENZONATATE 100 MG PO CAPS
200.0000 mg | ORAL_CAPSULE | Freq: Three times a day (TID) | ORAL | Status: DC | PRN
  Administered 2015-01-19 (×3): 200 mg via ORAL
  Filled 2015-01-19 (×3): qty 2

## 2015-01-19 MED ORDER — CALCIUM CARBONATE 1250 (500 CA) MG PO TABS
1000.0000 mg | ORAL_TABLET | Freq: Three times a day (TID) | ORAL | Status: DC
  Administered 2015-01-19 – 2015-01-24 (×15): 1000 mg via ORAL
  Filled 2015-01-19 (×16): qty 2

## 2015-01-19 MED ORDER — DEXTROSE 5 % IV SOLN
1.0000 g | INTRAVENOUS | Status: DC
  Administered 2015-01-19 – 2015-01-23 (×5): 1 g via INTRAVENOUS
  Filled 2015-01-19 (×6): qty 10

## 2015-01-19 MED ORDER — CLONIDINE HCL 0.2 MG PO TABS
0.2000 mg | ORAL_TABLET | Freq: Two times a day (BID) | ORAL | Status: DC
  Administered 2015-01-19 – 2015-01-24 (×8): 0.2 mg via ORAL
  Filled 2015-01-19 (×8): qty 1

## 2015-01-19 MED ORDER — GUAIFENESIN-DM 100-10 MG/5ML PO SYRP
10.0000 mL | ORAL_SOLUTION | ORAL | Status: DC | PRN
  Administered 2015-01-19 – 2015-01-24 (×6): 10 mL via ORAL
  Filled 2015-01-19 (×6): qty 10

## 2015-01-19 MED ORDER — SODIUM CHLORIDE 0.9 % IV SOLN
INTRAVENOUS | Status: DC
  Administered 2015-01-19 – 2015-01-20 (×2): via INTRAVENOUS

## 2015-01-19 MED ORDER — SODIUM CHLORIDE 0.9 % IV SOLN
1.0000 g | Freq: Once | INTRAVENOUS | Status: AC
  Administered 2015-01-19: 1 g via INTRAVENOUS
  Filled 2015-01-19: qty 10

## 2015-01-19 MED ORDER — FUROSEMIDE 10 MG/ML IJ SOLN
INTRAMUSCULAR | Status: AC
  Filled 2015-01-19: qty 10

## 2015-01-19 MED ORDER — LEVOFLOXACIN IN D5W 250 MG/50ML IV SOLN
INTRAVENOUS | Status: AC
  Filled 2015-01-19: qty 50

## 2015-01-19 MED ORDER — DEXTROSE 5 % IV SOLN
500.0000 mg | INTRAVENOUS | Status: DC
  Administered 2015-01-19 – 2015-01-20 (×2): 500 mg via INTRAVENOUS
  Filled 2015-01-19 (×3): qty 500

## 2015-01-19 MED ORDER — BRIMONIDINE TARTRATE 0.2 % OP SOLN
OPHTHALMIC | Status: AC
  Filled 2015-01-19: qty 5

## 2015-01-19 NOTE — Progress Notes (Signed)
Rather intriguing patient who has resisted 20 discussions to prepare for dialysis or see an nephrologist or obtain a shunt presents with symptoms of possible volume overload BUN 10 hypertension uncontrolled diabetes who presented with increasing cough and dyspnea seen by nephrology given IV Lasix has had insulin-dependent diabetes for many years. Her troponins minimally elevated will perform serial troponins and obtain 2-D echo to assess cardiac systolic and diastolic function Venancio PoissonFranklin M Griffey ZOX:[id number] DOB: 05/13/1976 DOA: 01/18/2015 PCP: Isabella StallingNDIEGO,Colonel Krauser M, MD             Physical Exam: Blood pressure 176/80, pulse 110, temperature 100.4 F (38 C), temperature source Oral, resp. rate 24, height 6\' 1"  (1.854 m), weight 309 lb 15.5 oz (140.6 kg), SpO2 96 %. neck no JVD no carotid bruits no thyromegaly lungs show diminished breath sounds in the bases coarse rhonchi bilaterally no minimal bibasilar crepitations are clear with cough no wheeze audible. Heart regular rhythm no S3 or S4 no heaves thrills rubs   Investigations:  Recent Results (from the past 240 hour(s))  MRSA PCR Screening     Status: None   Collection Time: 01/19/15  7:00 AM  Result Value Ref Range Status   MRSA by PCR NEGATIVE NEGATIVE Final    Comment:        The GeneXpert MRSA Assay (FDA approved for NASAL specimens only), is one component of a comprehensive MRSA colonization surveillance program. It is not intended to diagnose MRSA infection nor to guide or monitor treatment for MRSA infections.      Basic Metabolic Panel:  Recent Labs  81/19/1403/24/16 2110 01/19/15 0429  NA 137 133*  K 4.2 4.2  CL 94* 93*  CO2 26 26  GLUCOSE 166* 275*  BUN 75* 78*  CREATININE 11.23* 10.87*  CALCIUM 6.5* 6.2*  PHOS  --  8.2*   Liver Function Tests:  Recent Labs  01/18/15 2115  AST 31  ALT 25  ALKPHOS 65  BILITOT 0.6  PROT 7.7  ALBUMIN 2.9*     CBC:  Recent Labs  01/18/15 2145 01/19/15 0429  01/19/15 0724  WBC 12.1* 9.8 9.9  NEUTROABS 8.9*  --   --   HGB 7.0* 6.1* 6.1*  HCT 21.4* 19.1* 18.8*  MCV 76.2* 76.4* 76.1*  PLT 186 193 178    Dg Chest 2 View  01/18/2015   CLINICAL DATA:  Nonproductive cough. Fever and shortness of breath. Chest pain.  EXAM: CHEST  2 VIEW  COMPARISON:  Chest x-rays dated 09/17/2013 and 10/16/2012  FINDINGS: The patient has patchy infiltrates in both lower lobes. There is chronic lateral pleural thickening on the right. Heart size and pulmonary vascularity are normal.  On the lateral view there is increased density at the inferior aspect of the hilar regions which may be a summation shadow of the pulmonary infiltrates but follow-up is recommended.  No effusions.  No acute osseous abnormality.  IMPRESSION: New bilateral infiltrates at the lung bases. Follow-up is recommended to ensure clearing.   Electronically Signed   By: Francene BoyersJames  Maxwell M.D.   On: 01/18/2015 21:05   [place] Renal  01/19/2015   CLINICAL DATA:  Acute renal failure and history of chronic kidney disease.  EXAM: RENAL/URINARY TRACT ULTRASOUND COMPLETE  COMPARISON:  Prior renal ultrasound on 11/08/2012  FINDINGS: Right Kidney:  Length: 14 cm. Although renal length is stable since the prior ultrasound, there clearly has been some loss of cortical volume since the prior ultrasound. The renal cortex is mildly echogenic. No evidence  of hydronephrosis. No focal masses identified.  Left Kidney:  Length: 13.7 cm. Similar findings as the right kidney with stable length but some interval loss of cortical volume since the prior ultrasound. Renal cortex is mildly echogenic. No evidence of hydronephrosis or focal lesion.  Bladder:  Appears normal for degree of bladder distention.  IMPRESSION: Some loss of renal cortical volume present bilaterally since prior ultrasound in 2014. No evidence of renal obstruction bilaterally.   Electronically Signed   By: Irish Lack M.D.   On: 01/19/2015 09:07      Medications:    Impression: Chronic noncompliance Insulin-dependent diabetes Stage IV renal failure Significant anemia possibly multifactorial  Principal Problem:   Acute renal failure superimposed on stage 4 chronic kidney disease Active Problems:   Hypertension   Diabetes   Leukocytosis   Bilateral pulmonary infiltrates on chest x-ray     Plan: Obtain hemoglobin A1c anemia profile TSH RPR B med daily hemoglobin and hematocrit daily and consider for dialysis if patient allows [place] to combat volume overload status  Consultants: Nephrology   Procedures   Antibiotics: Levaquin                  Code Status: Full  Family Communication:  Long toe with patient and sister  Disposition Plan serial blood work early dialysis patient will permit add clonidine TTS 2 for better blood pressure control  Time spent: 30 minutes  LOS: 1 day   Quante Pettry M   01/19/2015, 2:31 PM

## 2015-01-19 NOTE — Significant Event (Addendum)
Called with critical lab values: hypocalcemia and anemia of 6.1.  Hypocalcemia - replacing with calcium gluconate 1gm IV (has peripherals only, no central access to do calcium chloride).  Anemia with HGB 6.1 - very large drop of HGB in this patient (was 7.0 on admit) with no obvious signs or stigmata of bleed or GIB for that matter (hasnt even had BM overnight).  Will repeat CBC for now.  Patient does have a long history of anemia associated with CKD stage 4: his HGB was 7.4 prior to this admit on last set of labs which was 14 months ago, and only running in the 8s shortly before that.

## 2015-01-19 NOTE — Progress Notes (Signed)
  Echocardiogram 2D Echocardiogram has been performed.  Stacey DrainWhite, Tamalyn Wadsworth J 01/19/2015, 4:20 PM

## 2015-01-19 NOTE — Care Management Note (Addendum)
    Page 1 of 1   01/24/2015     2:29:56 PM CARE MANAGEMENT NOTE 01/24/2015  Patient:  Donald PoissonSETTLE,Werner M   Account Number:  [id number]  Date Initiated:  01/19/2015  Documentation initiated by:  Kathyrn SheriffHILDRESS,JESSICA  Subjective/Objective Assessment:   Pt is from home, lives with son and friend. Pt requires minimal assistance at baseline. Pt has CAP aid sevel days a week. Pt has a prosthesis, walker and wheelchair. Pt says he mostly uses the wheelchair and doesn't wear his prosthesis.     Action/Plan:   Pt has no other DME's or HH services. Pt plans to discharge home with coninuation of previous arrangmenets. Will cont to follow for CM needs.   Anticipated DC Date:  01/19/2015   Anticipated DC Plan:  HOME/SELF CARE      DC Planning Services  CM consult      Choice offered to / List presented to:             Status of service:  Completed, signed off Medicare Important Message given?  YES (If response is "NO", the following Medicare IM given date fields will be blank) Date Medicare IM given:  01/19/2015 Medicare IM given by:  Kathyrn SheriffHILDRESS,JESSICA Date Additional Medicare IM given:  01/24/2015 Additional Medicare IM given by:  Sharrie RothmanAMMY C Lelaina Oatis  Discharge Disposition:  HOME/SELF CARE  Per UR Regulation:  Reviewed for med. necessity/level of care/duration of stay  If discussed at Long Length of Stay Meetings, dates discussed:    Comments:  01/24/15 1430 Arlyss Queenammy Ayana Imhof, RN BSN CM Pt discharged home today. CSW has arranged pts outpt dialysis and pt is aware of schedule. Pts CAP aide is with pt 7 days a week. No DME needs noted. Pt and pts nurse aware of discharge arrangements.  01/19/2015 1400 Kathyrn SheriffJessica Childress, RN, MSN, CM

## 2015-01-19 NOTE — Progress Notes (Signed)
Lab called with critical lab values . Calcium 6.2 and hemoglobin is 6.1. MD notified. Orders being sent and nephrologist is being contacted at this time by Doctor.

## 2015-01-19 NOTE — Progress Notes (Signed)
Pt states he would like to try to use urinals at this time instead of having foley catheter inserted for aggressive diureses. Two urinals are placed at the bedside.

## 2015-01-19 NOTE — Consult Note (Signed)
Reason for Consult: Acute kidney injury superimposed on chronic Referring Physician: Dr. Renie Ora Donald Sampson is an 39 y.o. male.  HPI: He is a patient has history of hypertension, diabetes, history of chronic renal failure stage IV presently came with complaints of cough with sputum production for the last couple of days. When he was evaluated patient was found to have significantly elevated BUN and creatinine hence consult called. Presently patient denies any nausea or vomiting. His appetite is also good. Patient at this moment denies any difficulty breathing and no orthopnea.  Past Medical History  Diagnosis Date  . Diabetes mellitus   . Hypertension   . Glaucoma (increased eye pressure) 2011  . Microcytic anemia 04/25/2013  . 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 Blind in both eyes     Past Surgical History  Procedure Laterality Date  . Refractive surgery    . Left bka      No family history on file.  Social History:  reports that he has never smoked. He has never used smokeless tobacco. He reports that he does not drink alcohol or use illicit drugs.  Allergies: No Known Allergies  Medications: I have reviewed the patient's current medications.  Results for orders placed or performed during the hospital encounter of 01/18/15 (from the past 48 hour(s))  Basic metabolic panel     Status: Abnormal   Collection Time: 01/18/15  9:10 PM  Result Value Ref Range   Sodium 137 135 - 145 mmol/L   Potassium 4.2 3.5 - 5.1 mmol/L   Chloride 94 (L) 96 - 112 mmol/L   CO2 26 19 - 32 mmol/L   Glucose, Bld 166 (H) 70 - 99 mg/dL   BUN 75 (H) 6 - 23 mg/dL   Creatinine, Ser 11.23 (H) 0.50 - 1.35 mg/dL   Calcium 6.5 (L) 8.4 - 10.5 mg/dL   GFR calc non Af Amer 5 (L) >90 mL/min   GFR calc Af Amer 6 (L) >90 mL/min    Comment: (NOTE) The eGFR has been calculated using the CKD EPI equation. This calculation has not been validated in all clinical situations. eGFR's persistently <90 mL/min  signify possible Chronic Kidney Disease.    Anion gap 17 (H) 5 - 15  Troponin I     Status: Abnormal   Collection Time: 01/18/15  9:15 PM  Result Value Ref Range   Troponin I 0.09 (H) <0.031 ng/mL    Comment:        PERSISTENTLY INCREASED TROPONIN VALUES IN THE RANGE OF 0.04-0.49 ng/mL CAN BE SEEN IN:       -UNSTABLE ANGINA       -CONGESTIVE HEART FAILURE       -MYOCARDITIS       -CHEST TRAUMA       -ARRYHTHMIAS       -LATE PRESENTING MYOCARDIAL INFARCTION       -COPD   CLINICAL FOLLOW-UP RECOMMENDED.   Hepatic function panel     Status: Abnormal   Collection Time: 01/18/15  9:15 PM  Result Value Ref Range   Total Protein 7.7 6.0 - 8.3 g/dL   Albumin 2.9 (L) 3.5 - 5.2 g/dL   AST 31 0 - 37 U/L   ALT 25 0 - 53 U/L   Alkaline Phosphatase 65 39 - 117 U/L   Total Bilirubin 0.6 0.3 - 1.2 mg/dL   Bilirubin, Direct 0.1 0.0 - 0.5 mg/dL   Indirect Bilirubin 0.5 0.3 - 0.9 mg/dL  CBC with Differential/Platelet  Status: Abnormal   Collection Time: 01/18/15  9:45 PM  Result Value Ref Range   WBC 12.1 (H) 4.0 - 10.5 K/uL   RBC 2.81 (L) 4.22 - 5.81 MIL/uL   Hemoglobin 7.0 (L) 13.0 - 17.0 g/dL   HCT 21.4 (L) 39.0 - 52.0 %   MCV 76.2 (L) 78.0 - 100.0 fL   MCH 24.9 (L) 26.0 - 34.0 pg   MCHC 32.7 30.0 - 36.0 g/dL   RDW 15.2 11.5 - 15.5 %   Platelets 186 150 - 400 K/uL   Neutrophils Relative % 74 43 - 77 %   Neutro Abs 8.9 (H) 1.7 - 7.7 K/uL   Lymphocytes Relative 9 (L) 12 - 46 %   Lymphs Abs 1.1 0.7 - 4.0 K/uL   Monocytes Relative 16 (H) 3 - 12 %   Monocytes Absolute 1.9 (H) 0.1 - 1.0 K/uL   Eosinophils Relative 1 0 - 5 %   Eosinophils Absolute 0.2 0.0 - 0.7 K/uL   Basophils Relative 0 0 - 1 %   Basophils Absolute 0.0 0.0 - 0.1 K/uL  Brain natriuretic peptide     Status: None   Collection Time: 01/18/15 11:02 PM  Result Value Ref Range   B Natriuretic Peptide 100.0 0.0 - 100.0 pg/mL  Troponin I (q 6hr x 3)     Status: Abnormal   Collection Time: 01/18/15 11:02 PM  Result  Value Ref Range   Troponin I 0.09 (H) <0.031 ng/mL    Comment:        PERSISTENTLY INCREASED TROPONIN VALUES IN THE RANGE OF 0.04-0.49 ng/mL CAN BE SEEN IN:       -UNSTABLE ANGINA       -CONGESTIVE HEART FAILURE       -MYOCARDITIS       -CHEST TRAUMA       -ARRYHTHMIAS       -LATE PRESENTING MYOCARDIAL INFARCTION       -COPD   CLINICAL FOLLOW-UP RECOMMENDED.   Glucose, capillary     Status: Abnormal   Collection Time: 01/19/15 12:48 AM  Result Value Ref Range   Glucose-Capillary 185 (H) 70 - 99 mg/dL   Comment 1 Notify RN   CBC     Status: Abnormal   Collection Time: 01/19/15  4:29 AM  Result Value Ref Range   WBC 9.8 4.0 - 10.5 K/uL   RBC 2.50 (L) 4.22 - 5.81 MIL/uL   Hemoglobin 6.1 (LL) 13.0 - 17.0 g/dL    Comment: DELTA CHECK NOTED RESULT REPEATED AND VERIFIED CRITICAL RESULT CALLED TO, READ BACK BY AND VERIFIED WITH: CALLED TO OLGA MCCLOUD ON [id number] AT 0620 BY RESSEGGER R    HCT 19.1 (L) 39.0 - 52.0 %   MCV 76.4 (L) 78.0 - 100.0 fL   MCH 24.4 (L) 26.0 - 34.0 pg   MCHC 31.9 30.0 - 36.0 g/dL   RDW 15.3 11.5 - 15.5 %   Platelets 193 150 - 400 K/uL  Basic metabolic panel     Status: Abnormal   Collection Time: 01/19/15  4:29 AM  Result Value Ref Range   Sodium 133 (L) 135 - 145 mmol/L   Potassium 4.2 3.5 - 5.1 mmol/L   Chloride 93 (L) 96 - 112 mmol/L   CO2 26 19 - 32 mmol/L   Glucose, Bld 275 (H) 70 - 99 mg/dL   BUN 78 (H) 6 - 23 mg/dL   Creatinine, Ser 10.87 (H) 0.50 - 1.35 mg/dL   Calcium 6.2 (LL)  8.4 - 10.5 mg/dL    Comment: CRITICAL RESULT CALLED TO, READ BACK BY AND VERIFIED WITH: MCLEOD,U AT 6:30AM ON 01/19/15 BY FESTERMAN,C    GFR calc non Af Amer 5 (L) >90 mL/min   GFR calc Af Amer 6 (L) >90 mL/min    Comment: (NOTE) The eGFR has been calculated using the CKD EPI equation. This calculation has not been validated in all clinical situations. eGFR's persistently <90 mL/min signify possible Chronic Kidney Disease.    Anion gap 14 5 - 15  Troponin I (q  6hr x 3)     Status: Abnormal   Collection Time: 01/19/15  4:29 AM  Result Value Ref Range   Troponin I 0.10 (H) <0.031 ng/mL    Comment:        PERSISTENTLY INCREASED TROPONIN VALUES IN THE RANGE OF 0.04-0.49 ng/mL CAN BE SEEN IN:       -UNSTABLE ANGINA       -CONGESTIVE HEART FAILURE       -MYOCARDITIS       -CHEST TRAUMA       -ARRYHTHMIAS       -LATE PRESENTING MYOCARDIAL INFARCTION       -COPD   CLINICAL FOLLOW-UP RECOMMENDED.     Dg Chest 2 View  01/18/2015   CLINICAL DATA:  Nonproductive cough. Fever and shortness of breath. Chest pain.  EXAM: CHEST  2 VIEW  COMPARISON:  Chest x-rays dated 09/17/2013 and 10/16/2012  FINDINGS: The patient has patchy infiltrates in both lower lobes. There is chronic lateral pleural thickening on the right. Heart size and pulmonary vascularity are normal.  On the lateral view there is increased density at the inferior aspect of the hilar regions which may be a summation shadow of the pulmonary infiltrates but follow-up is recommended.  No effusions.  No acute osseous abnormality.  IMPRESSION: New bilateral infiltrates at the lung bases. Follow-up is recommended to ensure clearing.   Electronically Signed   By: Lorriane Shire M.D.   On: 01/18/2015 21:05    Review of Systems  Constitutional: Positive for fever and chills.  Respiratory: Positive for cough and sputum production. Negative for hemoptysis and shortness of breath.   Cardiovascular: Negative for chest pain and orthopnea.  Gastrointestinal: Negative for nausea, vomiting and abdominal pain.   Blood pressure 147/92, pulse 111, temperature 98.4 F (36.9 C), temperature source Oral, resp. rate 22, height _0  (1.854 m), weight 140.6 kg (309 lb 15.5 oz), SpO2 100 %. Physical Exam  Constitutional: He is oriented to person, place, and time. No distress.  Eyes: Left eye exhibits no discharge.  Neck: No JVD present.  Cardiovascular: Normal rate and regular rhythm.   No murmur  heard. Respiratory: No respiratory distress. He has no wheezes.  GI: He exhibits no distension. There is no tenderness.  Musculoskeletal: He exhibits edema.  Trace edema  Neurological: He is alert and oriented to person, place, and time.    Assessment/Plan: Problem #1 possibly acute kidney injury superimposed on chronic. Etiology at this moment not clear but could be secondary to prerenal syndrome versus ATN. Presently patient does not have any uremic signs and symptoms. Patient is presently nonoliguric and his BUN and creatinine is improving. Problem #2 chronic renal failure: Stage IV thought to be secondary to diabetes/hypertension/recurrent acute kidney injury. Problem #3 nephrotic syndrome: Possibly from diabetes. Patient with polyclonal gammopathy. Problem #4 history of diabetes: Poorly controlled Problem #5 history of bladder, Problem #6 history of hypertension: His blood pressure is  reasonably controlled Problem #7 anemia: Possibly secondary to chronic renal failure however iron deficiency anemia cannot be ruled out. Problem #8 bilateral pneumonia. Presently on antibiotics and his white blood cell count has improved. Plan: We'll start patient on normal saline at 100 mL per hour We'll check his phosphorus/basic metabolic panel/PTH in the morning We'll start him on calcium carbonate 1 g by mouth 3 times a day I have discussed with him about possibility of dialysis if his kidney function doesn't recover. Patient is agreeable. We'll do iron studies in the morning. Agree with blood transfusion. We'll continue his Lasix at 160 mg IV twice a day   [facility] S 01/19/2015, 7:28 AM

## 2015-01-19 NOTE — Care Management Utilization Note (Signed)
UR completed 

## 2015-01-20 LAB — HEMOGLOBIN A1C
HEMOGLOBIN A1C: 9.8 % — AB (ref 4.8–5.6)
Mean Plasma Glucose: 235 mg/dL

## 2015-01-20 LAB — BASIC METABOLIC PANEL
Anion gap: 14 (ref 5–15)
BUN: 82 mg/dL — AB (ref 6–23)
CO2: 26 mmol/L (ref 19–32)
CREATININE: 11.2 mg/dL — AB (ref 0.50–1.35)
Calcium: 6.8 mg/dL — ABNORMAL LOW (ref 8.4–10.5)
Chloride: 95 mmol/L — ABNORMAL LOW (ref 96–112)
GFR calc non Af Amer: 5 mL/min — ABNORMAL LOW (ref >90)
GFR, EST AFRICAN AMERICAN: 6 mL/min — AB (ref >90)
Glucose, Bld: 116 mg/dL — ABNORMAL HIGH (ref 70–99)
Potassium: 4.4 mmol/L (ref 3.5–5.1)
Sodium: 135 mmol/L (ref 135–145)

## 2015-01-20 LAB — IRON AND TIBC
IRON: 18 ug/dL — AB (ref 42–165)
Saturation Ratios: 11 % — ABNORMAL LOW (ref 20–55)
TIBC: 171 ug/dL — AB (ref 215–435)
UIBC: 153 ug/dL (ref 125–400)

## 2015-01-20 LAB — GLUCOSE, CAPILLARY
GLUCOSE-CAPILLARY: 83 mg/dL (ref 70–99)
GLUCOSE-CAPILLARY: 139 mg/dL — AB (ref 70–99)
Glucose-Capillary: 103 mg/dL — ABNORMAL HIGH (ref 70–99)

## 2015-01-20 LAB — PTH, INTACT AND CALCIUM
Calcium, Total (PTH): 6.2
PTH: 194 pg/mL — ABNORMAL HIGH (ref 15–65)

## 2015-01-20 LAB — RPR: RPR: NONREACTIVE

## 2015-01-20 LAB — HIV ANTIBODY (ROUTINE TESTING W REFLEX): HIV SCREEN 4TH GENERATION: NONREACTIVE

## 2015-01-20 LAB — VITAMIN B12: VITAMIN B 12: 1203 pg/mL — AB (ref 211–911)

## 2015-01-20 LAB — FOLATE: Folate: 5.8 ng/mL

## 2015-01-20 LAB — PREPARE RBC (CROSSMATCH)

## 2015-01-20 LAB — HEMOGLOBIN AND HEMATOCRIT, BLOOD
HEMATOCRIT: 17.7 % — AB (ref 39.0–52.0)
HEMOGLOBIN: 5.8 g/dL — AB (ref 13.0–17.0)

## 2015-01-20 MED ORDER — SODIUM CHLORIDE 0.9 % IV SOLN: Freq: Once | INTRAVENOUS | Status: DC

## 2015-01-20 MED ORDER — HEPARIN SODIUM (PORCINE) 5000 UNIT/ML IJ SOLN
5000.0000 [IU] | Freq: Three times a day (TID) | INTRAMUSCULAR | Status: DC
  Administered 2015-01-21 – 2015-01-24 (×9): 5000 [IU] via SUBCUTANEOUS
  Filled 2015-01-20 (×9): qty 1

## 2015-01-20 NOTE — Progress Notes (Signed)
Dr. Wolfgang PhoenixBhutani ordered dialysis catheter placement. [facility] notified.  Earliest available time for placement is 8:30 am on Sunday, March, 27.  [facility] notified and will transfer patient as early as possible Sunday morning after shift change.  [facility] travel arrangements and accepting patient for tomorrow morning for round-trip dialysis cath placement.

## 2015-01-20 NOTE — Progress Notes (Signed)
Venancio PoissonFranklin M Marxen  MRN: [id number]  DOB/AGE: 1976-03-29 39 y.o.  Primary Care Physician:DONDIEGO,RICHARD M, MD  Admit date: 01/18/2015  Chief Complaint:  Chief Complaint  Patient presents with  . Cough    S-Pt presented on  01/18/2015 with  Chief Complaint  Patient presents with  . Cough  .     Pt offers no new complaints.     Pt understands the need for dialysis as pt mother is also on HD         Meds . sodium chloride   Intravenous Once  . amLODipine  5 mg Oral Daily  . azithromycin  500 mg Intravenous Q24H  . brimonidine  1 drop Left Eye TID  . calcium carbonate  1,000 mg of elemental calcium Oral TID WC  . cefTRIAXone (ROCEPHIN)  IV  1 g Intravenous Q24H  . cloNIDine  0.2 mg Oral BID  . furosemide  160 mg Intravenous BID  . heparin  5,000 Units Subcutaneous 3 times per day  . insulin aspart  0-9 Units Subcutaneous TID WC  . insulin aspart  3 Units Subcutaneous TID WC  . insulin glargine  20 Units Subcutaneous QHS  . nitroGLYCERIN  0.5 inch Topical 4 times per day  . sodium chloride  3 mL Intravenous Q12H  . sodium chloride  3 mL Intravenous Q12H      Physical Exam: Vital signs in last 24 hours: Temp:  [98.8 F (37.1 C)-99.9 F (37.7 C)] 98.8 F (37.1 C) (03/26 0500) Pulse Rate:  [87-115] 97 (03/26 1200) Resp:  [13-31] 23 (03/26 1200) BP: (110-196)/(46-96) 155/94 mmHg (03/26 1200) SpO2:  [94 %-100 %] 98 % (03/26 1200) Weight:  [311 lb 11.7 oz (141.4 kg)] 311 lb 11.7 oz (141.4 kg) (03/26 0500) Weight change: 1 lb 11.7 oz (0.785 kg) Last BM Date: 01/18/15  Intake/Output from previous day: 03/25 0701 - 03/26 0700 In: 3207 [P.O.:1080; I.V.:1585; IV Piggyback:542] Out: 3275 [Urine:3275]     Physical Exam: General- pt is awake,alert, oriented to time place and person Resp- No acute REsp distress, decreased bs at bases CVS- S1S2 regular in rate and rhythm GIT- BS+, soft, NT, ND EXT-left BKA +, Trace edema right side   Lab Results: CBC  Recent  Labs  01/19/15 0724 01/19/15 1455 01/20/15 0445  WBC 9.9 11.6*  --   HGB 6.1* 6.6* 5.8*  HCT 18.8* 20.0* 17.7*  PLT 178 231  --     BMET  Recent Labs  01/19/15 0429 01/20/15 0445  NA 133* 135  K 4.2 4.4  CL 93* 95*  CO2 26 26  GLUCOSE 275* 116*  BUN 78* 82*  CREATININE 10.87* 11.20*  CALCIUM 6.2*  6.2 6.8*   Creat trend 2016 11.2=>10.8=>11.2 2015 4.11 2014  7.25==>5.42             2.8--3.4  2013     3.28    MICRO Recent Results (from the past 240 hour(s))  MRSA PCR Screening     Status: None   Collection Time: 01/19/15  7:00 AM  Result Value Ref Range Status   MRSA by PCR NEGATIVE NEGATIVE Final    Comment:        The GeneXpert MRSA Assay (FDA approved for NASAL specimens only), is one component of a comprehensive MRSA colonization surveillance program. It is not intended to diagnose MRSA infection nor to guide or monitor treatment for MRSA infections.       Lab Results  Component Value Date  PTH 194* 01/19/2015   PTH Comment 01/19/2015   CALCIUM 6.8* 01/20/2015   PHOS 8.2* 01/19/2015     Impression: 1)Renal  AKI secondary to ATN/Prerenal                AKI on CKD vs CKD progression                Now near ESRD                Pt understands the need for HD                CKD stage 4.               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                Strong family hx of ESRD               2)HTN Target Organ damage  CKD PVD  Medication- On Diuretics On Calcium Channel Blockers On Central sympatholytics   3)Anemia HGb NOT at goal (9--11) Iron deficiency and Chronic disease Chronic disease Pt is receiving PRBC  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 Calcium not at goal  5)ID- Pneumoinia On  ABX  6)Electrolytes  Normokalemic    Hyponatremic    Sec to Hyperglycemia + CKD   7)Acid base Co2 at goal     Plan:   Will ask for tunneled cath placement Will start pt on Hd after the cath placement     Zaynah Chawla S 01/20/2015, 12:30 PM

## 2015-01-20 NOTE — Progress Notes (Signed)
Patient put out approximately 1200 mL of urine on my shift hemoglobin down to 5.8 iron studies still pending B-12 full liter within normal limits RPR TSH within normal limits 2-D echo reveals systolic function with an ejection fraction of 60-65% blood pressure in better control with the addition of clonidine . Patient ABOUT WANTING TO LEAVE THE HOSPITAL AMA I CAUTIONED HIM NOT TO THAT HE REQUIRES DIALYSIS RECENTLY SHUNT PLACEMENT Donald Sampson ZOX:[id number] DOB: Apr 09, 1976 DOA: 01/18/2015 PCP: Donald Stalling, MD             Physical Exam: Blood pressure 130/46, pulse 102, temperature 98.8 F (37.1 C), temperature source Oral, resp. rate 26, height  (1.854 m), weight 311 lb 11.7 oz (141.4 kg), SpO2 95 %. neck no JVD no carotid bruits no thyromegaly lungs show diminished breath sounds in the bases scattered rhonchi no rales appreciable heart regular rhythm no S3 or S4 no heaves rubs   Investigations:  Recent Results (from the past 240 hour(s))  MRSA PCR Screening     Status: None   Collection Time: 01/19/15  7:00 AM  Result Value Ref Range Status   MRSA by PCR NEGATIVE NEGATIVE Final    Comment:        The GeneXpert MRSA Assay (FDA approved for NASAL specimens only), is one component of a comprehensive MRSA colonization surveillance program. It is not intended to diagnose MRSA infection nor to guide or monitor treatment for MRSA infections.      Basic Metabolic Panel:  Recent Labs  81/19/14 2110 01/19/15 0429  NA 137 133*  K 4.2 4.2  CL 94* 93*  CO2 26 26  GLUCOSE 166* 275*  BUN 75* 78*  CREATININE 11.23* 10.87*  CALCIUM 6.5* 6.2*  PHOS  --  8.2*   Liver Function Tests:  Recent Labs  01/18/15 2115  AST 31  ALT 25  ALKPHOS 65  BILITOT 0.6  PROT 7.7  ALBUMIN 2.9*     CBC:  Recent Labs  01/18/15 2145  01/19/15 0724 01/19/15 1455 01/20/15 0445  WBC 12.1*  < > 9.9 11.6*  --   NEUTROABS 8.9*  --   --   --   --   HGB 7.0*  < > 6.1*  6.6* 5.8*  HCT 21.4*  < > 18.8* 20.0* 17.7*  MCV 76.2*  < > 76.1* 76.3*  --   PLT 186  < > 178 231  --   < > = values in this interval not displayed.  Dg Chest 2 View  01/18/2015   CLINICAL DATA:  Nonproductive cough. Fever and shortness of breath. Chest pain.  EXAM: CHEST  2 VIEW  COMPARISON:  Chest x-rays dated 09/17/2013 and 10/16/2012  FINDINGS: The patient has patchy infiltrates in both lower lobes. There is chronic lateral pleural thickening on the right. Heart size and pulmonary vascularity are normal.  On the lateral view there is increased density at the inferior aspect of the hilar regions which may be a summation shadow of the pulmonary infiltrates but follow-up is recommended.  No effusions.  No acute osseous abnormality.  IMPRESSION: New bilateral infiltrates at the lung bases. Follow-up is recommended to ensure clearing.   Electronically Signed   By: Francene Boyers M.D.   On: 01/18/2015 21:05   US Renal  01/19/2015   CLINICAL DATA:  Acute renal failure and history of chronic kidney disease.  EXAM: RENAL/URINARY TRACT ULTRASOUND COMPLETE  COMPARISON:  Prior renal ultrasound on 11/08/2012  FINDINGS: Right Kidney:  Length: 14 cm. Although renal length is stable since the prior ultrasound, there clearly has been some loss of cortical volume since the prior ultrasound. The renal cortex is mildly echogenic. No evidence of hydronephrosis. No focal masses identified.  Left Kidney:  Length: 13.7 cm. Similar findings as the right kidney with stable length but some interval loss of cortical volume since the prior ultrasound. Renal cortex is mildly echogenic. No evidence of hydronephrosis or focal lesion.  Bladder:  Appears normal for degree of bladder distention.  IMPRESSION: Some loss of renal cortical volume present bilaterally since prior ultrasound in 2014. No evidence of renal obstruction bilaterally.   Electronically Signed   By: Irish LackGlenn  Yamagata M.D.   On: 01/19/2015 09:07      Medications:   Impression: Anemia of chronic disease Chronic noncompliance  Principal Problem:   Acute renal failure superimposed on stage 4 chronic kidney disease Active Problems:   Hypertension   Diabetes   Leukocytosis   Bilateral pulmonary infiltrates on chest x-ray     Plan: Type and cross for 2 units of packed red blood cells transfuse 1 unit slowly monitor I&O's  Consultants: Nephrology    Procedures   Antibiotics:                  Code Status:   Family Communication:  Spoke with sister and patient this morning  Disposition Plan and she is one unit of PRBCs slowly monitor I and O's and renal function.  Time spent: 30 minutes   LOS: 2 days   Irma Roulhac M   01/20/2015, 6:17 AM

## 2015-01-21 ENCOUNTER — Ambulatory Visit (HOSPITAL_COMMUNITY)
Admission: RE | Admit: 2015-01-21 | Discharge: 2015-01-21 | Disposition: A | Discharge status: Home / Self Care | Payer: Medicare Other | Source: Ambulatory Visit | Attending: Nephrology | Admitting: Nephrology

## 2015-01-21 ENCOUNTER — Other Ambulatory Visit (HOSPITAL_COMMUNITY): Payer: Medicare Other

## 2015-01-21 ENCOUNTER — Ambulatory Visit (HOSPITAL_COMMUNITY)
Admission: RE | Admit: 2015-01-21 | Discharge: 2015-01-21 | Disposition: A | Discharge status: Home / Self Care | Payer: Medicare Other | Source: Ambulatory Visit | Attending: Interventional Radiology | Admitting: Interventional Radiology

## 2015-01-21 ENCOUNTER — Inpatient Hospital Stay (HOSPITAL_COMMUNITY): Payer: Medicare Other

## 2015-01-21 DIAGNOSIS — I129 Hypertensive chronic kidney disease with stage 1 through stage 4 chronic kidney disease, or unspecified chronic kidney disease: Secondary | ICD-10-CM | POA: Insufficient documentation

## 2015-01-21 DIAGNOSIS — H409 Unspecified glaucoma: Secondary | ICD-10-CM | POA: Insufficient documentation

## 2015-01-21 DIAGNOSIS — Z79899 Other long term (current) drug therapy: Secondary | ICD-10-CM | POA: Insufficient documentation

## 2015-01-21 DIAGNOSIS — Z794 Long term (current) use of insulin: Secondary | ICD-10-CM | POA: Insufficient documentation

## 2015-01-21 DIAGNOSIS — H540 Blindness, both eyes: Secondary | ICD-10-CM | POA: Insufficient documentation

## 2015-01-21 DIAGNOSIS — Z89512 Acquired absence of left leg below knee: Secondary | ICD-10-CM | POA: Insufficient documentation

## 2015-01-21 DIAGNOSIS — N179 Acute kidney failure, unspecified: Secondary | ICD-10-CM | POA: Insufficient documentation

## 2015-01-21 DIAGNOSIS — E119 Type 2 diabetes mellitus without complications: Secondary | ICD-10-CM | POA: Insufficient documentation

## 2015-01-21 DIAGNOSIS — N189 Chronic kidney disease, unspecified: Secondary | ICD-10-CM | POA: Insufficient documentation

## 2015-01-21 LAB — HEMOGLOBIN AND HEMATOCRIT, BLOOD
HCT: 22 % — ABNORMAL LOW (ref 39.0–52.0)
HEMATOCRIT: 20.1 % — AB (ref 39.0–52.0)
Hemoglobin: 6.5 g/dL — CL (ref 13.0–17.0)
Hemoglobin: 7.1 g/dL — ABNORMAL LOW (ref 13.0–17.0)

## 2015-01-21 LAB — GLUCOSE, CAPILLARY: GLUCOSE-CAPILLARY: 235 mg/dL — AB (ref 70–99)

## 2015-01-21 LAB — FERRITIN: Ferritin: 1597 ng/mL — ABNORMAL HIGH (ref 22–322)

## 2015-01-21 MED ORDER — PENTAFLUOROPROP-TETRAFLUOROETH EX AERO: 1.0000 "application " | INHALATION_SPRAY | CUTANEOUS | Status: DC | PRN

## 2015-01-21 MED ORDER — HEPARIN SODIUM (PORCINE) 1000 UNIT/ML IJ SOLN
INTRAMUSCULAR | Status: AC
  Administered 2015-01-21: 4100 [IU] via INTRAVENOUS_CENTRAL
  Filled 2015-01-21: qty 5

## 2015-01-21 MED ORDER — LIDOCAINE-PRILOCAINE 2.5-2.5 % EX CREA: 1.0000 "application " | TOPICAL_CREAM | CUTANEOUS | Status: DC | PRN

## 2015-01-21 MED ORDER — LIDOCAINE HCL (PF) 1 % IJ SOLN: 5.0000 mL | INTRAMUSCULAR | Status: DC | PRN

## 2015-01-21 MED ORDER — EPOETIN ALFA 10000 UNIT/ML IJ SOLN
10000.0000 [IU] | INTRAMUSCULAR | Status: DC
  Filled 2015-01-21 (×2): qty 1

## 2015-01-21 MED ORDER — CEFAZOLIN SODIUM-DEXTROSE 2-3 GM-% IV SOLR
INTRAVENOUS | Status: AC
  Filled 2015-01-21: qty 50

## 2015-01-21 MED ORDER — SODIUM CHLORIDE 0.9 % IV SOLN: 100.0000 mL | INTRAVENOUS | Status: DC | PRN

## 2015-01-21 MED ORDER — SODIUM CHLORIDE 0.9 % IV SOLN
INTRAVENOUS | Status: DC
  Administered 2015-01-21 (×2): via INTRAVENOUS

## 2015-01-21 MED ORDER — SODIUM CHLORIDE 0.9 % IV SOLN
INTRAVENOUS | Status: AC | PRN
Start: 2015-01-21 — End: 2015-01-21
  Administered 2015-01-21: 10 mL/h via INTRAVENOUS

## 2015-01-21 MED ORDER — HEPARIN SODIUM (PORCINE) 1000 UNIT/ML IJ SOLN
INTRAMUSCULAR | Status: AC
  Filled 2015-01-21: qty 1

## 2015-01-21 MED ORDER — HYDROCODONE-ACETAMINOPHEN 5-325 MG PO TABS
1.0000 | ORAL_TABLET | ORAL | Status: DC | PRN
  Administered 2015-01-21 – 2015-01-23 (×5): 1 via ORAL
  Filled 2015-01-21 (×5): qty 1

## 2015-01-21 MED ORDER — ALTEPLASE 2 MG IJ SOLR: 2.0000 mg | Freq: Once | INTRAMUSCULAR | Status: AC | PRN

## 2015-01-21 MED ORDER — FENTANYL CITRATE 0.05 MG/ML IJ SOLN
INTRAMUSCULAR | Status: AC | PRN
  Administered 2015-01-21: 50 ug via INTRAVENOUS

## 2015-01-21 MED ORDER — MIDAZOLAM HCL 2 MG/2ML IJ SOLN
INTRAMUSCULAR | Status: AC
  Filled 2015-01-21: qty 2

## 2015-01-21 MED ORDER — TUBERCULIN PPD 5 UNIT/0.1ML ID SOLN: 5.0000 [IU] | Freq: Once | INTRADERMAL | Status: DC

## 2015-01-21 MED ORDER — FENTANYL CITRATE 0.05 MG/ML IJ SOLN
INTRAMUSCULAR | Status: AC
  Filled 2015-01-21: qty 2

## 2015-01-21 MED ORDER — SODIUM CHLORIDE 0.9 % IV SOLN: Freq: Once | INTRAVENOUS | Status: AC

## 2015-01-21 MED ORDER — NEPRO/CARBSTEADY PO LIQD: 237.0000 mL | ORAL | Status: DC | PRN

## 2015-01-21 MED ORDER — MIDAZOLAM HCL 2 MG/2ML IJ SOLN
INTRAMUSCULAR | Status: AC | PRN
Start: 2015-01-21 — End: 2015-01-21
  Administered 2015-01-21: 1 mg via INTRAVENOUS

## 2015-01-21 MED ORDER — LIDOCAINE HCL 1 % IJ SOLN
INTRAMUSCULAR | Status: AC
  Filled 2015-01-21: qty 20

## 2015-01-21 MED ORDER — AZITHROMYCIN 250 MG PO TABS
500.0000 mg | ORAL_TABLET | Freq: Every day | ORAL | Status: DC
  Administered 2015-01-21 – 2015-01-23 (×3): 500 mg via ORAL
  Filled 2015-01-21 (×3): qty 2

## 2015-01-21 MED ORDER — ZOLPIDEM TARTRATE 5 MG PO TABS
5.0000 mg | ORAL_TABLET | Freq: Every evening | ORAL | Status: DC | PRN
  Administered 2015-01-22 – 2015-01-23 (×2): 5 mg via ORAL
  Filled 2015-01-21 (×2): qty 1

## 2015-01-21 MED ORDER — ACETAMINOPHEN 325 MG PO TABS: 650.0000 mg | ORAL_TABLET | ORAL | Status: DC | PRN

## 2015-01-21 MED ORDER — CEFAZOLIN (ANCEF) 1 G IV SOLR
INTRAVENOUS | Status: AC | PRN
  Administered 2015-01-21: 2 g

## 2015-01-21 MED ORDER — HEPARIN SODIUM (PORCINE) 1000 UNIT/ML DIALYSIS
1000.0000 [IU] | INTRAMUSCULAR | Status: DC | PRN
  Administered 2015-01-21 – 2015-01-22 (×2): 4100 [IU] via INTRAVENOUS_CENTRAL
  Administered 2015-01-24: 4200 [IU] via INTRAVENOUS_CENTRAL
  Filled 2015-01-21 (×4): qty 1

## 2015-01-21 NOTE — Procedures (Signed)
   INITIAL HEMODIALYSIS TREATMENT NOTE:  1st ever hemodialysis treatment completed via right IJ tunneled catheter.  2 hour treatment. Kept even; no fluid removed per MD order.  One unit PRBCs transfused with HD.  Tolerated procedure without complaints.  Report called to Richardean ChimeraMarion Martin, RN.  Arman FilterAngela Ladale Sherburn, RN, CDN

## 2015-01-21 NOTE — Progress Notes (Signed)
Pt currently asleep and obvious snoring noted with no drop in O2 sats and no prolonged apnea.

## 2015-01-21 NOTE — Progress Notes (Signed)
Pt just transported to [facility] in dialysis

## 2015-01-21 NOTE — Progress Notes (Signed)
Pt removes BP cuff at will.

## 2015-01-21 NOTE — Progress Notes (Signed)
Donald PoissonFranklin M Welge  MRN: [id number]  DOB/AGE: [birth year] 39 y.o.  Primary Care Physician:DONDIEGO,RICHARD M, MD  Admit date: 01/18/2015  Chief Complaint:  Chief Complaint  Patient presents with  . Cough    S-Pt presented on  01/18/2015 with  Chief Complaint  Patient presents with  . Cough  .     Pt offers no new complaints.     Pt understands the need for dialysis as pt mother is also on HD         Meds . sodium chloride   Intravenous Once  . amLODipine  5 mg Oral Daily  . azithromycin  500 mg Intravenous Q24H  . brimonidine  1 drop Left Eye TID  . calcium carbonate  1,000 mg of elemental calcium Oral TID WC  . cefTRIAXone (ROCEPHIN)  IV  1 g Intravenous Q24H  . cloNIDine  0.2 mg Oral BID  . furosemide  160 mg Intravenous BID  . heparin subcutaneous  5,000 Units Subcutaneous 3 times per day  . insulin aspart  0-9 Units Subcutaneous TID WC  . insulin aspart  3 Units Subcutaneous TID WC  . insulin glargine  20 Units Subcutaneous QHS  . nitroGLYCERIN  0.5 inch Topical 4 times per day  . sodium chloride  3 mL Intravenous Q12H  . sodium chloride  3 mL Intravenous Q12H      Physical Exam: Vital signs in last 24 hours: Temp:  [98.5 F (36.9 C)-100.1 F (37.8 C)] 98.6 F (37 C) (03/27 0400) Pulse Rate:  [87-102] 90 (03/27 0700) Resp:  [13-27] 20 (03/27 0700) BP: (117-164)/(62-95) 142/84 mmHg (03/27 0700) SpO2:  [96 %-100 %] 99 % (03/27 0700) Weight:  [313 lb 11.4 oz (142.3 kg)] 313 lb 11.4 oz (142.3 kg) (03/27 0447) Weight change: 1 lb 15.7 oz (0.9 kg) Last BM Date: 01/18/15  Intake/Output from previous day: 03/26 0701 - 03/27 0700 In: 1712 [P.O.:1200; Blood:330; IV Piggyback:182] Out: 1950 [Urine:1950]     Physical Exam: General- pt is awake,alert, oriented to time place and person Resp- No acute REsp distress, decreased bs at bases CVS- S1S2 regular in rate and rhythm GIT- BS+, soft, NT, ND EXT-left BKA +, Trace edema right side   Lab  Results: CBC  Recent Labs  01/19/15 0724 01/19/15 1455  01/20/15 2336 01/21/15 0441  WBC 9.9 11.6*  --   --   --   HGB 6.1* 6.6*  < > 7.1* 6.5*  HCT 18.8* 20.0*  < > 22.0* 20.1*  PLT 178 231  --   --   --   < > = values in this interval not displayed.  BMET  Recent Labs  01/19/15 0429 01/20/15 0445  NA 133* 135  K 4.2 4.4  CL 93* 95*  CO2 26 26  GLUCOSE 275* 116*  BUN 78* 82*  CREATININE 10.87* 11.20*  CALCIUM 6.2*  6.2 6.8*   Creat trend 2016 11.2=>10.8=>11.2 2015 4.11 2014  7.25==>5.42             2.8--3.4  2013     3.28    MICRO Recent Results (from the past 240 hour(s))  MRSA PCR Screening     Status: None   Collection Time: 01/19/15  7:00 AM  Result Value Ref Range Status   MRSA by PCR NEGATIVE NEGATIVE Final    Comment:        The GeneXpert MRSA Assay (FDA approved for NASAL specimens only), is one component of a comprehensive MRSA colonization surveillance  program. It is not intended to diagnose MRSA infection nor to guide or monitor treatment for MRSA infections.       Lab Results  Component Value Date   PTH 194* 01/19/2015   PTH Comment 01/19/2015   CALCIUM 6.8* 01/20/2015   PHOS 8.2* 01/19/2015     Impression: 1)Renal  AKI secondary to ATN/Prerenal                AKI on CKD vs CKD progression                Now near ESRD                Pt understands the need for HD                 Pt to go for Tunneled cath placement today               CKD stage 4.               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                Strong family hx of ESRD               2)HTN Target Organ damage  CKD PVD  Medication- On Diuretics On Calcium Channel Blockers On Central sympatholytics   3)Anemia HGb NOT at goal (9--11) Iron deficiency and Chronic disease Chronic disease Pt  is receiving PRBC  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 Calcium not at goal  5)ID- Pneumoinia On ABX  6)Electrolytes  Normokalemic    Hyponatremic    Sec to Hyperglycemia + CKD   7)Acid base Co2 at goal     Plan:  Will dialyze after Pc placement Will ask for PPD placement Will ask for Hepatitis profile Will dialyze for 2 hrs today as its first Hd session    Preslie Depasquale S 01/21/2015, 7:36 AM

## 2015-01-21 NOTE — Progress Notes (Signed)
PHARMACIST - PHYSICIAN COMMUNICATION DR:   DonDiego CONCERNING: Antibiotic IV to Oral Route Change Policy  RECOMMENDATION: This patient is receiving Zithromax by the intravenous route.  Based on criteria approved by the Pharmacy and Therapeutics Committee, the antibiotic(s) is/are being converted to the equivalent oral dose form(s).  DESCRIPTION: These criteria include:  Patient being treated for a respiratory tract infection, urinary tract infection, cellulitis or clostridium difficile associated diarrhea if on metronidazole  The patient is not neutropenic and does not exhibit a GI malabsorption state  The patient is eating (either orally or via tube) and/or has been taking other orally administered medications for a least 24 hours  The patient is improving clinically and has a Tmax < 100.5  If you have questions about this conversion, please contact the Pharmacy Department  [x]  ( [phone] )  [place] []  ( [phone] )  [place]  []  ( [phone] )  [facility] []  ( [phone] )  [facility]   S. Wilber Fini, PharmD  

## 2015-01-21 NOTE — Progress Notes (Signed)
Patient physically at [facility] getting permacath for presumed dialysis today hemoglobin 6.5 after 1 unit PRBCs will transfuse second unit later today when he returns he appears hemodynamically stable Donald Sampson OYW:[id number] DOB: 03-02-1976 DOA: 01/18/2015 PCP: [name], MD             Physical Exam: Blood pressure 142/87, pulse 89, temperature 98.6 F (37 C), temperature source Oral, resp. rate 18, height 6' 1" (1.854 m), weight 313 lb 11.4 oz (142.3 kg), SpO2 100 %.   Investigations:  Recent Results (from the past 240 hour(s))  MRSA PCR Screening     Status: None   Collection Time: 01/19/15  7:00 AM  Result Value Ref Range Status   MRSA by PCR NEGATIVE NEGATIVE Final    Comment:        The GeneXpert MRSA Assay (FDA approved for NASAL specimens only), is one component of a comprehensive MRSA colonization surveillance program. It is not intended to diagnose MRSA infection nor to guide or monitor treatment for MRSA infections.      Basic Metabolic Panel:  Recent Labs  01/19/15 0429 01/20/15 0445  NA 133* 135  K 4.2 4.4  CL 93* 95*  CO2 26 26  GLUCOSE 275* 116*  BUN 78* 82*  CREATININE 10.87* 11.20*  CALCIUM 6.2*  6.2 6.8*  PHOS 8.2*  --    Liver Function Tests:  Recent Labs  01/18/15 2115  AST 31  ALT 25  ALKPHOS 65  BILITOT 0.6  PROT 7.7  ALBUMIN 2.9*     CBC:  Recent Labs  01/18/15 2145  01/19/15 0724 01/19/15 1455  01/20/15 2336 01/21/15 0441  WBC 12.1*  < > 9.9 11.6*  --   --   --   NEUTROABS 8.9*  --   --   --   --   --   --   HGB 7.0*  < > 6.1* 6.6*  < > 7.1* 6.5*  HCT 21.4*  < > 18.8* 20.0*  < > 22.0* 20.1*  MCV 76.2*  < > 76.1* 76.3*  --   --   --   PLT 186  < > 178 231  --   --   --   < > = values in this interval not displayed.  No results found.    Medication  Impression:  Principal Problem:   Acute renal failure superimposed on stage 4 chronic kidney disease Active Problems:   Hypertension   Diabetes   Leukocytosis   Bilateral pulmonary infiltrates on chest x-ray     Plan: Patient on IV Rocephin and by mouth Zithromax for bilateral pulmonary infiltrates O'Quinn DC'd due to possible nephrotoxicity is receiving Premarin cath for dialysis will transfuse second unit PRBCs today on dialysis this afternoon area she'll have postreinfusion and daily hemoglobin and hematocrit as well as be met  Consultants: Interventional radiology and nephrology   Procedures   Antibiotics: Rocephin and Zithromax                  Code Status:  Family Communication:    Disposition Plan permacath today dialysis transfuse second unit PRBCs monitor posttransfusion H&H and renal function after dialysis  Time spent: 30 minutes   LOS: 3 days   Tiauna Whisnant M   01/21/2015, 10:05 AM

## 2015-01-21 NOTE — Procedures (Signed)
Interventional Radiology Procedure Note  Procedure: Placement of a right IJ approach HD catheter.  23cm tip to cuff.  Tip is positioned at the superior cavoatrial junction and catheter is ready for immediate use.  Complications: No immediate Recommendations:  - Ok to shower tomorrow - Do not submerge for 7 days - Routine line care   Signed,  Yvone NeuJaime S. Loreta AveWagner, DO

## 2015-01-21 NOTE — Consult Note (Signed)
Chief Complaint: Acute on Chronic Renal Failure.  Elevated creatinine.   Referring Physician(s): Bhutani,Manpreet S  History of Present Illness: Donald Sampson is a 39 y.o. male referred for a tunneled HD cathter placement by the ICU staff at [facility].    The patient has been admitted to the ICU with acute on chronic renal failure, with elevated creatinine, and need to initiate hemodialysis. A tunneled line has been requested.     Past Medical History  Diagnosis Date  . Diabetes mellitus   . Hypertension   . Glaucoma (increased eye pressure) 2011  . Microcytic anemia 04/25/2013  . 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 Blind in both eyes     Past Surgical History  Procedure Laterality Date  . Refractive surgery    . Left bka      Allergies: Review of patient's allergies indicates no known allergies.  Medications: Prior to Admission medications   Medication Sig Start Date End Date Taking? Authorizing Provider  amLODipine (NORVASC) 5 MG tablet Take 5 mg by mouth daily.    Historical Provider, MD  brimonidine (ALPHAGAN) 0.2 % ophthalmic solution Place 1 drop into the left eye 3 (three) times daily. 12/02/14   Ivery QualeHobson Bryant, PA-C  insulin aspart (NOVOLOG) 100 UNIT/ML injection Inject 15 Units into the skin 3 (three) times daily with meals. 09/12/13   Avon Gullyesfaye Fanta, MD  insulin glargine (LANTUS) 100 UNIT/ML injection Inject 30 Units into the skin at bedtime.     Historical Provider, MD  linagliptin (TRADJENTA) 5 MG TABS tablet Take 1 tablet (5 mg total) by mouth daily. 05/30/14   Tammi Triplett, PA-C     No family history on file.  History   Social History  . Marital Status: Single    Spouse Name: N/A  . Number of Children: N/A  . Years of Education: N/A   Social History Main Topics  . Smoking status: Never Smoker   . Smokeless tobacco: Never Used  . Alcohol Use: No  . Drug Use: No  . Sexual Activity: Not Currently    Birth Control/ Protection: Condom     Other Topics Concern  . Not on file   Social History Narrative     Review of Systems: A 12 point ROS discussed and pertinent positives are indicated in the HPI above.  All other systems are negative.  Review of Systems  Vital Signs: There were no vitals taken for this visit.  Physical Exam  Atraumatic, normocephalic.  Patient is blind bilateral. No injection. Neck is full, soft supple.  No adenopathy.  CTA bilateral.  No wheezes, rhonchi or rales.  RRR.  No third heart sounds appreciated.  Abd soft NT.  No distension.  No peritoneal signs. GU deferred. Left BKA.  Healed.  No RLE wounds. Scar on medial aspect from prior wound.  2+ pitting edema.    Mallampati Score:  3  Imaging: Dg Chest 2 View  01/18/2015   CLINICAL DATA:  Nonproductive cough. Fever and shortness of breath. Chest pain.  EXAM: CHEST  2 VIEW  COMPARISON:  Chest x-rays dated 09/17/2013 and 10/16/2012  FINDINGS: The patient has patchy infiltrates in both lower lobes. There is chronic lateral pleural thickening on the right. Heart size and pulmonary vascularity are normal.  On the lateral view there is increased density at the inferior aspect of the hilar regions which may be a summation shadow of the pulmonary infiltrates but follow-up is recommended.  No effusions.  No acute osseous  abnormality.  IMPRESSION: New bilateral infiltrates at the lung bases. Follow-up is recommended to ensure clearing.   Electronically Signed   By: Francene Boyers M.D.   On: 01/18/2015 21:05   US Renal  01/19/2015   CLINICAL DATA:  Acute renal failure and history of chronic kidney disease.  EXAM: RENAL/URINARY TRACT ULTRASOUND COMPLETE  COMPARISON:  Prior renal ultrasound on 11/08/2012  FINDINGS: Right Kidney:  Length: 14 cm. Although renal length is stable since the prior ultrasound, there clearly has been some loss of cortical volume since the prior ultrasound. The renal cortex is mildly echogenic. No evidence of hydronephrosis. No  focal masses identified.  Left Kidney:  Length: 13.7 cm. Similar findings as the right kidney with stable length but some interval loss of cortical volume since the prior ultrasound. Renal cortex is mildly echogenic. No evidence of hydronephrosis or focal lesion.  Bladder:  Appears normal for degree of bladder distention.  IMPRESSION: Some loss of renal cortical volume present bilaterally since prior ultrasound in 2014. No evidence of renal obstruction bilaterally.   Electronically Signed   By: Irish Lack M.D.   On: 01/19/2015 09:07    Labs:  CBC:  Recent Labs  01/18/15 2145 01/19/15 0429 01/19/15 0724 01/19/15 1455 01/20/15 0445 01/20/15 2336 01/21/15 0441  WBC 12.1* 9.8 9.9 11.6*  --   --   --   HGB 7.0* 6.1* 6.1* 6.6* 5.8* 7.1* 6.5*  HCT 21.4* 19.1* 18.8* 20.0* 17.7* 22.0* 20.1*  PLT 186 193 178 231  --   --   --     COAGS: No results for input(s): INR, APTT in the last 8760 hours.  BMP:  Recent Labs  01/18/15 2110 01/19/15 0429 01/20/15 0445  NA 137 133* 135  K 4.2 4.2 4.4  CL 94* 93* 95*  CO2 GLUCOSE 166* 275* 116*  BUN 75* 78* 82*  CALCIUM 6.5* 6.2*  6.2 6.8*  CREATININE 11.23* 10.87* 11.20*  GFRNONAA 5* 5* 5*  GFRAA 6* 6* 6*    LIVER FUNCTION TESTS:  Recent Labs  01/18/15 2115  BILITOT 0.6  AST 31  ALT 25  ALKPHOS 65  PROT 7.7  ALBUMIN 2.9*    TUMOR MARKERS: No results for input(s): AFPTM, CEA, CA199, CHROMGRNA in the last 8760 hours.  Assessment and Plan:  39 yo male with acute on chronic renal failure, with need to initiate HD.  Will place tunneled HD catheter.    Thank you for this interesting consult.  I greatly enjoyed meeting Donald Sampson and look forward to participating in their care.  SignedGilmer Mor 01/21/2015, 9:24 AM   I spent a total of 40 Minutes    in face to face in clinical consultation, greater than 50% of which was counseling/coordinating care for acute on chronic renal failure and placement of  HD cathter.  Risks and Benefits discussed with the patient including, but not limited to bleeding, infection, vascular injury, pneumothorax which may require chest tube placement, air embolism or even death All of the patient's questions were answered, patient is agreeable to proceed. Consent signed and in chart. Signed,  Yvone Neu. Loreta Ave, DO

## 2015-01-22 LAB — GLUCOSE, CAPILLARY
Glucose-Capillary: 167 mg/dL — ABNORMAL HIGH (ref 70–99)
Glucose-Capillary: 313 mg/dL — ABNORMAL HIGH (ref 70–99)
Glucose-Capillary: 161 mg/dL — ABNORMAL HIGH (ref 70–99)
Glucose-Capillary: 112 mg/dL — ABNORMAL HIGH (ref 70–99)
Glucose-Capillary: 183 mg/dL — ABNORMAL HIGH (ref 70–99)
Glucose-Capillary: 263 mg/dL — ABNORMAL HIGH (ref 70–99)
Glucose-Capillary: 151 mg/dL — ABNORMAL HIGH (ref 70–99)
Glucose-Capillary: 105 mg/dL — ABNORMAL HIGH (ref 70–99)

## 2015-01-22 LAB — CBC WITH DIFFERENTIAL/PLATELET
Basophils Absolute: 0 10*3/uL (ref 0.0–0.1)
Basophils Relative: 0 % (ref 0–1)
EOS ABS: 0.2 10*3/uL (ref 0.0–0.7)
Eosinophils Relative: 2 % (ref 0–5)
HCT: 23.3 % — ABNORMAL LOW (ref 39.0–52.0)
Hemoglobin: 7.5 g/dL — ABNORMAL LOW (ref 13.0–17.0)
LYMPHS ABS: 0.8 10*3/uL (ref 0.7–4.0)
LYMPHS PCT: 9 % — AB (ref 12–46)
MCH: 25 pg — AB (ref 26.0–34.0)
MCHC: 32.2 g/dL (ref 30.0–36.0)
MCV: 77.7 fL — ABNORMAL LOW (ref 78.0–100.0)
Monocytes Absolute: 1 10*3/uL (ref 0.1–1.0)
Monocytes Relative: 12 % (ref 3–12)
NEUTROS PCT: 77 % (ref 43–77)
Neutro Abs: 6.6 10*3/uL (ref 1.7–7.7)
Platelets: 329 10*3/uL (ref 150–400)
RBC: 3 MIL/uL — AB (ref 4.22–5.81)
RDW: 15 % (ref 11.5–15.5)
WBC: 8.6 10*3/uL (ref 4.0–10.5)

## 2015-01-22 LAB — HEPATITIS B SURFACE ANTIGEN: Hepatitis B Surface Ag: NEGATIVE

## 2015-01-22 LAB — HEPATITIS PANEL, ACUTE
HCV Ab: NEGATIVE
Hep A IgM: NONREACTIVE
Hep B C IgM: NONREACTIVE
Hepatitis B Surface Ag: NEGATIVE

## 2015-01-22 LAB — PREPARE RBC (CROSSMATCH)

## 2015-01-22 LAB — RETICULOCYTES
RBC.: 3 MIL/uL — AB (ref 4.22–5.81)
RETIC CT PCT: 1.7 % (ref 0.4–3.1)
Retic Count, Absolute: 51 10*3/uL (ref 19.0–186.0)

## 2015-01-22 MED ORDER — SODIUM CHLORIDE 0.9 % IV SOLN: 100.0000 mL | INTRAVENOUS | Status: DC | PRN

## 2015-01-22 MED ORDER — SODIUM CHLORIDE 0.9 % IJ SOLN
10.0000 mL | INTRAMUSCULAR | Status: DC | PRN
Start: 2015-01-22 — End: 2015-01-24
  Administered 2015-01-22 – 2015-01-24 (×3): 10 mL via INTRAVENOUS
  Filled 2015-01-22 (×3): qty 10

## 2015-01-22 MED ORDER — SODIUM CHLORIDE 0.9 % IJ SOLN
INTRAMUSCULAR | Status: AC
  Administered 2015-01-22: 10 mL via INTRAVENOUS
  Filled 2015-01-22: qty 12

## 2015-01-22 MED ORDER — HEPARIN SODIUM (PORCINE) 1000 UNIT/ML IJ SOLN
INTRAMUSCULAR | Status: AC
  Administered 2015-01-22: 4100 [IU] via INTRAVENOUS_CENTRAL
  Filled 2015-01-22: qty 5

## 2015-01-22 MED ORDER — EPOETIN ALFA 10000 UNIT/ML IJ SOLN
10000.0000 [IU] | INTRAMUSCULAR | Status: DC
  Administered 2015-01-24: 10000 [IU] via INTRAVENOUS
  Filled 2015-01-22: qty 1

## 2015-01-22 MED ORDER — POLYSACCHARIDE IRON COMPLEX 150 MG PO CAPS
150.0000 mg | ORAL_CAPSULE | Freq: Every day | ORAL | Status: DC
  Administered 2015-01-22 – 2015-01-24 (×3): 150 mg via ORAL
  Filled 2015-01-22 (×3): qty 1

## 2015-01-22 MED ORDER — SODIUM CHLORIDE 0.9 % IV SOLN: Freq: Once | INTRAVENOUS | Status: DC

## 2015-01-22 MED ORDER — SODIUM CHLORIDE 0.9 % IJ SOLN
10.0000 mL | INTRAMUSCULAR | Status: DC | PRN
  Administered 2015-01-22 – 2015-01-24 (×3): 10 mL via INTRAVENOUS
  Filled 2015-01-22 (×3): qty 10

## 2015-01-22 MED ORDER — ALTEPLASE 2 MG IJ SOLR: 2.0000 mg | Freq: Once | INTRAMUSCULAR | Status: AC | PRN

## 2015-01-22 NOTE — Progress Notes (Signed)
Patient had first episode of dialysis after permacatheter last night after 2 units PRBCs hemoglobin 6.5 today type crossmatch for 2 more units and transfuse 1 unit today anemia profile reveals presumed anemia of chronic disease with possible concomitant iron deficiencies Donald Sampson BZJ:[id number] DOB: [birth year] DOA: 01/18/2015 PCP: [name], MD             Physical Exam: Blood pressure 166/87, pulse 95, temperature 98.8 F (37.1 C), temperature source Oral, resp. rate 20, height 6' 1"  (1.854 m), weight 142 lb 4.8 oz (64.547 kg), SpO2 97 %. neck no JVD no carotid bruits no thyromegaly lungs diminished breath sounds at bases no rales wheeze or rhonchi appreciable heart regular rhythm no S3 or S4 no heaves thrills rubs abdomen soft nontender bowel sounds normoactive   Investigations:  Recent Results (from the past 240 hour(s))  MRSA PCR Screening     Status: None   Collection Time: 01/19/15  7:00 AM  Result Value Ref Range Status   MRSA by PCR NEGATIVE NEGATIVE Final    Comment:        The GeneXpert MRSA Assay (FDA approved for NASAL specimens only), is one component of a comprehensive MRSA colonization surveillance program. It is not intended to diagnose MRSA infection nor to guide or monitor treatment for MRSA infections.      Basic Metabolic Panel:  Recent Labs  01/20/15 0445  NA 135  K 4.4  CL 95*  CO2 26  GLUCOSE 116*  BUN 82*  CREATININE 11.20*  CALCIUM 6.8*   Liver Function Tests: No results for input(s): AST, ALT, ALKPHOS, BILITOT, PROT, ALBUMIN in the last 72 hours.   CBC:  Recent Labs  01/19/15 0724 01/19/15 1455  01/20/15 2336 01/21/15 0441  WBC 9.9 11.6*  --   --   --   HGB 6.1* 6.6*  < > 7.1* 6.5*  HCT 18.8* 20.0*  < > 22.0* 20.1*  MCV 76.1* 76.3*  --   --   --   PLT 178 231  --   --   --   < > = values in this interval not displayed.  Ir Fluoro Guide Cv Line Right  01/21/2015   INDICATION: 39 year old male with a  history of acute on chronic renal failure.  The patient has been referred for tunneled HD catheter placement.  EXAM: TUNNELED CENTRAL VENOUS HEMODIALYSIS CATHETER PLACEMENT WITH ULTRASOUND AND FLUOROSCOPIC GUIDANCE  MEDICATIONS: Ancef 2 gm IV; The IV antibiotic was given in an appropriate time interval prior to skin puncture.  CONTRAST:  None  ANESTHESIA/SEDATION: Versed 1.0 mg IV; Fentanyl 50 mcg IV  Total Moderate Sedation Time  Seventeen minutes.  FLUOROSCOPY TIME:  Zero minutes 48 seconds.  COMPLICATIONS: None  PROCEDURE: Informed written consent was obtained from the patient after a discussion of the risks, benefits, and alternatives to treatment. Questions regarding the procedure were encouraged and answered. The right neck and chest were prepped with chlorhexidine in a sterile fashion, and a sterile drape was applied covering the operative field. Maximum barrier sterile technique with sterile gowns and gloves were used for the procedure. A timeout was performed prior to the initiation of the procedure.  After creating a small venotomy incision, a micropuncture kit was utilized to access the right internal jugular vein under direct, real-time ultrasound guidance after the overlying soft tissues were anesthetized with 1% lidocaine with epinephrine. Ultrasound image documentation was performed. The microwire was kinked to measure appropriate catheter length. A stiff glidewire was advanced  to the level of the IVC and the micropuncture sheath was exchanged for a peel-away sheath. A split tip tunneled hemodialysis catheter measuring 23 cm from tip to cuff was tunneled in a retrograde fashion from the anterior chest wall to the venotomy incision.  The catheter was then placed through the peel-away sheath with tips ultimately positioned within the superior aspect of the right atrium. Final catheter positioning was confirmed and documented with a spot radiographic image. The catheter aspirates and flushes normally,  with excellent flow confirmed. The catheter was flushed with appropriate volume heparin dwells.  The catheter exit site was secured with a 0-Prolene retention suture. The venotomy incision was closed with Dermabond and Steri-strips. Dressings were applied. The patient tolerated the procedure well without immediate post procedural complication.  No complications were encountered.  No significant blood loss.  IMPRESSION: Status post placement of right IJ tunneled hemodialysis split tip catheter measuring 23 cm tip to cuff. Catheter ready for use.  Signed,  Dulcy Fanny. Earleen Newport, DO  [facility]  [facility]   Electronically Signed   By: Corrie Mckusick D.O.   On: 01/21/2015 10:07   Ir US Guide Vasc Access Right  01/21/2015   INDICATION: 39 year old male with a history of acute on chronic renal failure.  The patient has been referred for tunneled HD catheter placement.  EXAM: TUNNELED CENTRAL VENOUS HEMODIALYSIS CATHETER PLACEMENT WITH ULTRASOUND AND FLUOROSCOPIC GUIDANCE  MEDICATIONS: Ancef 2 gm IV; The IV antibiotic was given in an appropriate time interval prior to skin puncture.  CONTRAST:  None  ANESTHESIA/SEDATION: Versed 1.0 mg IV; Fentanyl 50 mcg IV  Total Moderate Sedation Time  Seventeen minutes.  FLUOROSCOPY TIME:  Zero minutes 48 seconds.  COMPLICATIONS: None  PROCEDURE: Informed written consent was obtained from the patient after a discussion of the risks, benefits, and alternatives to treatment. Questions regarding the procedure were encouraged and answered. The right neck and chest were prepped with chlorhexidine in a sterile fashion, and a sterile drape was applied covering the operative field. Maximum barrier sterile technique with sterile gowns and gloves were used for the procedure. A timeout was performed prior to the initiation of the procedure.  After creating a small venotomy incision, a micropuncture kit was utilized to access the right internal jugular  vein under direct, real-time ultrasound guidance after the overlying soft tissues were anesthetized with 1% lidocaine with epinephrine. Ultrasound image documentation was performed. The microwire was kinked to measure appropriate catheter length. A stiff glidewire was advanced to the level of the IVC and the micropuncture sheath was exchanged for a peel-away sheath. A split tip tunneled hemodialysis catheter measuring 23 cm from tip to cuff was tunneled in a retrograde fashion from the anterior chest wall to the venotomy incision.  The catheter was then placed through the peel-away sheath with tips ultimately positioned within the superior aspect of the right atrium. Final catheter positioning was confirmed and documented with a spot radiographic image. The catheter aspirates and flushes normally, with excellent flow confirmed. The catheter was flushed with appropriate volume heparin dwells.  The catheter exit site was secured with a 0-Prolene retention suture. The venotomy incision was closed with Dermabond and Steri-strips. Dressings were applied. The patient tolerated the procedure well without immediate post procedural complication.  No complications were encountered.  No significant blood loss.  IMPRESSION: Status post placement of right IJ tunneled hemodialysis split tip catheter measuring 23 cm tip to cuff. Catheter ready for use.  Signed,  Dulcy Fanny. Earleen Newport, DO  [facility]  [facility]iology   Electronically Signed   By: Corrie Mckusick D.O.   On: 01/21/2015 10:07      Medications:   Impression: Community-acquired pneumonia  Principal Problem:   Acute renal failure superimposed on stage 4 chronic kidney disease Active Problems:   Hypertension   Diabetes   Leukocytosis   Bilateral pulmonary infiltrates on chest x-ray     Plan: Continue Rocephin and by mouth Zithromax having crossmatch for 2 units PRBCs transfuse 1 unit with dialysis today reticulocyte  count and CBC ordered for today  Consultants: Nephrology    Procedures permacath catheter placement yesterday    Antibiotics: IV Rocephin and by mouth Zithromax                  Code Status: Full  Family Communication:    Disposition Plan continue with dialysis as per nephrology  Crossmatch for 2 additional units of PRBCs transfuse 1 unit today reticulocyte and CBC ordered  Time spent: 30 minutes   LOS: 4 days   Jisell Majer M   01/22/2015, 6:50 AM

## 2015-01-22 NOTE — Progress Notes (Signed)
Results for Venancio PoissonSETTLE, Najeeb M (MRN [id number]) as of 01/22/2015 14:55  Ref. Range 01/21/2015 07:44 01/21/2015 11:58 01/21/2015 23:04 01/22/2015 07:22 01/22/2015 11:32  Glucose-Capillary Latest Range: 70-99 mg/dL 409161 (H) 811112 (H) 914235 (H) 183 (H) 263 (H)  01/22/15  Noted PO intake is 75-100%.  Might consider increasing meal coverage insulin to 4 units tid with meals.  Mellissa KohutSherrie Commodore Bellew RD, CDE. M.Ed. Pager [phone] Inpatient Diabetes Coordinator

## 2015-01-22 NOTE — Progress Notes (Signed)
Subjective: Interval History: has no complaint of nausea or vomiting. He denies any difficulty breathing..  Objective: Vital signs in last 24 hours: Temp:  [98 F (36.7 C)-98.8 F (37.1 C)] 98.8 F (37.1 C) (03/28 0546) Pulse Rate:  [86-101] 95 (03/28 0546) Resp:  [16-20] 20 (03/28 0546) BP: (146-181)/(73-89) 166/87 mmHg (03/28 0546) SpO2:  [96 %-98 %] 96 % (03/28 0911) Weight:  [64.547 kg (142 lb 4.8 oz)-142.4 kg (313 lb 15 oz)] 64.547 kg (142 lb 4.8 oz) (03/28 0546) Weight change: 0.1 kg (3.5 oz)  Intake/Output from previous day: 03/27 0701 - 03/28 0700 In: 818 [P.O.:480; I.V.:3; Blood:335] Out: 2715 [Urine:2750] Intake/Output this shift: Total I/O In: -  Out: 250 [Urine:250]  General appearance: alert, cooperative and no distress Resp: wheezes bilaterally Cardio: regular rate and rhythm, S1, S2 normal, no murmur, click, rub or gallop GI: soft, non-tender; bowel sounds normal; no masses,  no organomegaly Extremities: edema 1+ edema  Lab Results:  Recent Labs  01/19/15 1455  01/21/15 0441 01/22/15 0808  WBC 11.6*  --   --  8.6  HGB 6.6*  < > 6.5* 7.5*  HCT 20.0*  < > 20.1* 23.3*  PLT 231  --   --  329  < > = values in this interval not displayed. BMET:  Recent Labs  01/20/15 0445  NA 135  K 4.4  CL 95*  CO2 26  GLUCOSE 116*  BUN 82*  CREATININE 11.20*  CALCIUM 6.8*   No results for input(s): PTH in the last 72 hours. Iron Studies:  Recent Labs  01/20/15 0445  IRON 18*  TIBC 171*  FERRITIN 1597*    Studies/Results: Ir Fluoro Guide Cv Line Right  01/21/2015   INDICATION: 39 year old male with a history of acute on chronic renal failure.  The patient has been referred for tunneled HD catheter placement.  EXAM: TUNNELED CENTRAL VENOUS HEMODIALYSIS CATHETER PLACEMENT WITH ULTRASOUND AND FLUOROSCOPIC GUIDANCE  MEDICATIONS: Ancef 2 gm IV; The IV antibiotic was given in an appropriate time interval prior to skin puncture.  CONTRAST:  None   ANESTHESIA/SEDATION: Versed 1.0 mg IV; Fentanyl 50 mcg IV  Total Moderate Sedation Time  Seventeen minutes.  FLUOROSCOPY TIME:  Zero minutes 48 seconds.  COMPLICATIONS: None  PROCEDURE: Informed written consent was obtained from the patient after a discussion of the risks, benefits, and alternatives to treatment. Questions regarding the procedure were encouraged and answered. The right neck and chest were prepped with chlorhexidine in a sterile fashion, and a sterile drape was applied covering the operative field. Maximum barrier sterile technique with sterile gowns and gloves were used for the procedure. A timeout was performed prior to the initiation of the procedure.  After creating a small venotomy incision, a micropuncture kit was utilized to access the right internal jugular vein under direct, real-time ultrasound guidance after the overlying soft tissues were anesthetized with 1% lidocaine with epinephrine. Ultrasound image documentation was performed. The microwire was kinked to measure appropriate catheter length. A stiff glidewire was advanced to the level of the IVC and the micropuncture sheath was exchanged for a peel-away sheath. A split tip tunneled hemodialysis catheter measuring 23 cm from tip to cuff was tunneled in a retrograde fashion from the anterior chest wall to the venotomy incision.  The catheter was then placed through the peel-away sheath with tips ultimately positioned within the superior aspect of the right atrium. Final catheter positioning was confirmed and documented with a spot radiographic image. The catheter aspirates and  flushes normally, with excellent flow confirmed. The catheter was flushed with appropriate volume heparin dwells.  The catheter exit site was secured with a 0-Prolene retention suture. The venotomy incision was closed with Dermabond and Steri-strips. Dressings were applied. The patient tolerated the procedure well without immediate post procedural complication.   No complications were encountered.  No significant blood loss.  IMPRESSION: Status post placement of right IJ tunneled hemodialysis split tip catheter measuring 23 cm tip to cuff. Catheter ready for use.  Signed,  Dulcy Fanny. Earleen Newport, DO  [facility]  [facility]   Electronically Signed   By: Corrie Mckusick D.O.   On: 01/21/2015 10:07   Ir US Guide Vasc Access Right  01/21/2015   INDICATION: 39 year old male with a history of acute on chronic renal failure.  The patient has been referred for tunneled HD catheter placement.  EXAM: TUNNELED CENTRAL VENOUS HEMODIALYSIS CATHETER PLACEMENT WITH ULTRASOUND AND FLUOROSCOPIC GUIDANCE  MEDICATIONS: Ancef 2 gm IV; The IV antibiotic was given in an appropriate time interval prior to skin puncture.  CONTRAST:  None  ANESTHESIA/SEDATION: Versed 1.0 mg IV; Fentanyl 50 mcg IV  Total Moderate Sedation Time  Seventeen minutes.  FLUOROSCOPY TIME:  Zero minutes 48 seconds.  COMPLICATIONS: None  PROCEDURE: Informed written consent was obtained from the patient after a discussion of the risks, benefits, and alternatives to treatment. Questions regarding the procedure were encouraged and answered. The right neck and chest were prepped with chlorhexidine in a sterile fashion, and a sterile drape was applied covering the operative field. Maximum barrier sterile technique with sterile gowns and gloves were used for the procedure. A timeout was performed prior to the initiation of the procedure.  After creating a small venotomy incision, a micropuncture kit was utilized to access the right internal jugular vein under direct, real-time ultrasound guidance after the overlying soft tissues were anesthetized with 1% lidocaine with epinephrine. Ultrasound image documentation was performed. The microwire was kinked to measure appropriate catheter length. A stiff glidewire was advanced to the level of the IVC and the micropuncture sheath was exchanged  for a peel-away sheath. A split tip tunneled hemodialysis catheter measuring 23 cm from tip to cuff was tunneled in a retrograde fashion from the anterior chest wall to the venotomy incision.  The catheter was then placed through the peel-away sheath with tips ultimately positioned within the superior aspect of the right atrium. Final catheter positioning was confirmed and documented with a spot radiographic image. The catheter aspirates and flushes normally, with excellent flow confirmed. The catheter was flushed with appropriate volume heparin dwells.  The catheter exit site was secured with a 0-Prolene retention suture. The venotomy incision was closed with Dermabond and Steri-strips. Dressings were applied. The patient tolerated the procedure well without immediate post procedural complication.  No complications were encountered.  No significant blood loss.  IMPRESSION: Status post placement of right IJ tunneled hemodialysis split tip catheter measuring 23 cm tip to cuff. Catheter ready for use.  Signed,  Dulcy Fanny. Earleen Newport, DO  [facility]  [facility]iology   Electronically Signed   By: Corrie Mckusick D.O.   On: 01/21/2015 10:07    I have reviewed the patient's current medications.  Assessment/Plan: Problem #1 renal failure: Patient is started on dialysis. He has received 2 also of dialysis. He denies any nausea or vomiting. Problem #2 hypertension: His blood pressure is reasonably controlled Problem #3 history of diabetes Problem #4 anemia: His hemoglobin  and hematocrit is very low. Presently is below target goal. Problem #5 history of cough with bilateral pulmonary infiltrate. Presently he is on antibiotics seems to be feeling better.  Problem #6 metabolic bone disease: His calcium is range however his phosphorus is high. Problem #7 history of morbid obesity Problem #8 glaucoma Plan: We'll make arrangements for patient to get dialysis today We'll start  patient on Epogen 10,000 units IV after each dialysis. We'll do PPD We'll check his basic metabolic panel and CBC in the morning We'll start patient on PhosLo 667 mg 2 tablets by mouth 3 times a day with meals.   LOS: 4 days   Colt Martelle S 01/22/2015,12:25 PM

## 2015-01-22 NOTE — Clinical Social Work Psychosocial (Signed)
Clinical Social Work Department BRIEF PSYCHOSOCIAL ASSESSMENT 01/22/2015  Patient:  Donald Sampson, Donald Sampson     Account Number:  [id number]     [place] date:  01/18/2015  Clinical Social Worker:  Wyatt Haste  Date/Time:  01/22/2015 10:43 AM  Referred by:  CSW  Date Referred:  01/22/2015 Referred for  Other - See comment   Other Referral:   outpatient dialysis   Interview type:  Patient Other interview type:    PSYCHOSOCIAL DATA Living Status:  WITH MINOR CHILDREN Admitted from facility:   Level of care:   Primary support name:  Donald Sampson Primary support relationship to patient:  PARENT Degree of support available:   supportive per pt    CURRENT CONCERNS Current Concerns  Other - See comment   Other Concerns:    SOCIAL WORK ASSESSMENT / PLAN CSW met with pt at bedside. Pt alert and oriented and gave permission for friend to also be present during assessment. Pt reports he and his 34 year old son live in an apartment in [place]. His mother, Donald Sampson lives across the hall and he describes her as his best support. 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 Pt indicates he manages well at home and plans to return at d/c. He came to ED due to cough and shortness of breath. Pt indicates that he knew it was going to be about much more once his labs came back. He was aware at some point he would need to start dialysis, but was certainly not expecting it this soon. He seems to have accepted this. CSW provided support. Pt's mother is also on dialysis. Pt generally relies on family for transport. He will discuss getting to dialysis with them, but also may consider [facility] or [facility]. Pt requests [facility] on [address].   Assessment/plan status:  Psychosocial Support/Ongoing Assessment of Needs Other assessment/ plan:   Information/referral to community resources:   [employer]    PATIENT'S/FAMILY'S RESPONSE TO PLAN OF CARE: CSW will initiate new outpatient dialysis at [facility] and follow up with pt  regarding schedule.       Donald Sampson, [place]

## 2015-01-22 NOTE — Progress Notes (Signed)
UR chart review completed.  

## 2015-01-22 NOTE — Clinical Documentation Improvement (Signed)
Please specify diagnosis related to below supporting information, if appropriate.    Possible Clinical Conditions? Acute Systolic Congestive Heart Failure Acute Diastolic Congestive Heart Failure Acute Systolic & Diastolic Congestive Heart Failure Acute on Chronic Systolic Congestive Heart Failure Acute on Chronic Diastolic Congestive Heart Failure Acute on Chronic Systolic & Diastolic Congestive Heart Failure Other Condition________________________________________ Cannot Clinically Determine  Supporting Information:  Per 01/18/15 H&P = Serial troponins given the mild elevation of 0.09 initially, I doubt that this represents ACS however, and am much more suspicious of acute CHF due to fluid retention.     Thank You, Shelda Palarlene H Kinsley Nicklaus ,RN Clinical Documentation Specialist:  [phone]  [facility]- Health Information Management

## 2015-01-22 NOTE — Procedures (Signed)
   HEMODIALYSIS TREATMENT NOTE:  4 hour heparin-free dialysis completed via right IJ tunneled catheter.  Exit site unremarkable.  Goal met:  3 liters removed without interruption in ultrafiltration.  All blood reinfused.  Rocephin dose rescheduled/given at end of HD.  1 unit PRBCs transfused with HD.  Report called to [place], Therapist, sports.  [place], Rn, CDN

## 2015-01-23 LAB — GLUCOSE, CAPILLARY
GLUCOSE-CAPILLARY: 205 mg/dL — AB (ref 70–99)
GLUCOSE-CAPILLARY: 169 mg/dL — AB (ref 70–99)
GLUCOSE-CAPILLARY: 243 mg/dL — AB (ref 70–99)
Glucose-Capillary: 302 mg/dL — ABNORMAL HIGH (ref 70–99)

## 2015-01-23 LAB — BASIC METABOLIC PANEL
ANION GAP: 12 (ref 5–15)
BUN: 28 mg/dL — ABNORMAL HIGH (ref 6–23)
CHLORIDE: 96 mmol/L (ref 96–112)
CO2: 29 mmol/L (ref 19–32)
CREATININE: 5.61 mg/dL — AB (ref 0.50–1.35)
Calcium: 8.3 mg/dL — ABNORMAL LOW (ref 8.4–10.5)
GFR calc Af Amer: 14 mL/min — ABNORMAL LOW (ref >90)
GFR, EST NON AFRICAN AMERICAN: 12 mL/min — AB (ref >90)
GLUCOSE: 167 mg/dL — AB (ref 70–99)
Potassium: 3.7 mmol/L (ref 3.5–5.1)
Sodium: 137 mmol/L (ref 135–145)

## 2015-01-23 LAB — PREPARE RBC (CROSSMATCH)

## 2015-01-23 MED ORDER — SODIUM CHLORIDE 0.9 % IV SOLN
Freq: Once | INTRAVENOUS | Status: AC
  Administered 2015-01-23: 18:00:00 via INTRAVENOUS

## 2015-01-23 NOTE — Clinical Social Work Note (Signed)
[facility] and they are unable to start pt until next week. Pt is nearing d/c. CSW notified them that we would need to contact another clinic. [facility] aware of [facility]  [facility] fax[facility]   Derenda FennelKara Briel Gallicchio, [place] [phone]

## 2015-01-23 NOTE — Progress Notes (Signed)
Nutrition Brief Note  Patient could benefit from education regarding Consistent Carb and Renal diets  Wt Readings from Last 15 Encounters:  01/23/15 136 lb 14.5 oz (62.1 kg)  12/02/14 265 lb (120.203 kg)  05/30/14 265 lb (120.203 kg)  01/02/14 260 lb (117.935 kg)  10/31/13 260 lb (117.935 kg)  09/10/13 292 lb 15.9 oz (132.9 kg)  05/26/13 297 lb 9.6 oz (134.99 kg)  05/04/13 279 lb 15.8 oz (127 kg)  04/06/13 291 lb 4.8 oz (132.133 kg)  11/06/12 268 lb 4.8 oz (121.7 kg)  10/23/12 265 lb (120.203 kg)  10/16/12 260 lb (117.935 kg)  01/12/12 270 lb (122.471 kg)  Incorrect weight this admission  Current diet order is Renal/CC, patient is consuming approximately 100% of meals at this time. Labs and medications reviewed.   Patient had requested double servings of juice. Briefly educated patient on the reason he was on Consistent Carb diet and the need to follow.   He is tired of the foods he is receiving. Gave him options to choose from and set up a dinner for him.  Touched base about home diet and what changes he could make to better control blood glucose levels.  Pt did not seem too interested in what I had to say.  No nutrition interventions warranted at this time. If nutrition issues arise, please consult RD.   Christophe LouisNathan Frankey Botting RD, LDN Nutrition Pager: [phone] 01/23/2015 12:58 PM

## 2015-01-23 NOTE — Progress Notes (Signed)
Inpatient Diabetes Program Recommendations  AACE/ADA: New Consensus Statement on Inpatient Glycemic Control (2013)  Target Ranges:  Prepandial:   less than 140 mg/dL      Peak postprandial:   less than 180 mg/dL (1-2 hours)      Critically ill patients:  140 - 180 mg/dL   Results for Donald PoissonSETTLE, Yossi M (MRN [id number]) as of 01/23/2015 10:33  Ref. Range 01/22/2015 07:22 01/22/2015 11:32 01/22/2015 16:58 01/22/2015 23:24 01/23/2015 07:28  Glucose-Capillary Latest Range: 70-99 mg/dL 528183 (H) 413263 (H) 244151 (H) 105 (H) 205 (H)   Diabetes history: DM2 Outpatient Diabetes medications: Lantus 30 units QHS, Novolog 15 units TID with meals, Tradjenta 5 mg daily Current orders for Inpatient glycemic control: Lantus 20 units QHS, Novolog 0-9 units TID with meals, Novolog 3 units TID with meals for meal coverage  Inpatient Diabetes Program Recommendations Insulin - Basal: Please consider increasing Lantus to 23 units QHS. Insulin - Meal Coverage: Please consider increasing Novolog meal coverage to 5 units TID with meals if patient eats at least 50% of meals. HgbA1C: A1C 9.8% on 01/19/15; however not sure of accuracy due to low hemoglobin.  Thanks, Orlando PennerMarie Lynzy Rawles, RN, MSN, CCRN, CDE Diabetes Coordinator Inpatient Diabetes Program [phone] (Team Pager from 8am to 5pm) [phone] ([facility] office) [phone] [facility] office)

## 2015-01-23 NOTE — Progress Notes (Signed)
Patient had second episode of dialysis still anemic will receive fourth unit of PRBCs today and low transferrin saturations low iron and normal TIBC possible anemia of chronic disease with concomitant iron deficiency patient was placed on oral iron Donald Sampson VVO:[id number] DOB: 08/06/1976 DOA: 01/18/2015 PCP: [name], MD             Physical Exam: Blood pressure 162/86, pulse 88, temperature 99.2 F (37.3 C), temperature source Oral, resp. rate 20, height _0  (1.854 m), weight 136 lb 14.5 oz (62.1 kg), SpO2 97 %. lungs diminished breath sounds at bases no rales wheeze rhonchi heart regular rhythm no S3-S4 no heaves thrills rubs   Investigations:  Recent Results (from the past 240 hour(s))  MRSA PCR Screening     Status: None   Collection Time: 01/19/15  7:00 AM  Result Value Ref Range Status   MRSA by PCR NEGATIVE NEGATIVE Final    Comment:        The GeneXpert MRSA Assay (FDA approved for NASAL specimens only), is one component of a comprehensive MRSA colonization surveillance program. It is not intended to diagnose MRSA infection nor to guide or monitor treatment for MRSA infections.      Basic Metabolic Panel:  Recent Labs  01/23/15 0629  NA 137  K 3.7  CL 96  CO2 29  GLUCOSE 167*  BUN 28*  CREATININE 5.61*  CALCIUM 8.3*   Liver Function Tests: No results for input(s): AST, ALT, ALKPHOS, BILITOT, PROT, ALBUMIN in the last 72 hours.   CBC:  Recent Labs  01/21/15 0441 01/22/15 0808  WBC  --  8.6  NEUTROABS  --  6.6  HGB 6.5* 7.5*  HCT 20.1* 23.3*  MCV  --  77.7*  PLT  --  329    Ir Fluoro Guide Cv Line Right  01/21/2015   INDICATION: 39 year old male with a history of acute on chronic renal failure.  The patient has been referred for tunneled HD catheter placement.  EXAM: TUNNELED CENTRAL VENOUS HEMODIALYSIS CATHETER PLACEMENT WITH ULTRASOUND AND FLUOROSCOPIC GUIDANCE  MEDICATIONS: Ancef 2 gm IV; The IV antibiotic was given  in an appropriate time interval prior to skin puncture.  CONTRAST:  None  ANESTHESIA/SEDATION: Versed 1.0 mg IV; Fentanyl 50 mcg IV  Total Moderate Sedation Time  Seventeen minutes.  FLUOROSCOPY TIME:  Zero minutes 48 seconds.  COMPLICATIONS: None  PROCEDURE: Informed written consent was obtained from the patient after a discussion of the risks, benefits, and alternatives to treatment. Questions regarding the procedure were encouraged and answered. The right neck and chest were prepped with chlorhexidine in a sterile fashion, and a sterile drape was applied covering the operative field. Maximum barrier sterile technique with sterile gowns and gloves were used for the procedure. A timeout was performed prior to the initiation of the procedure.  After creating a small venotomy incision, a micropuncture kit was utilized to access the right internal jugular vein under direct, real-time ultrasound guidance after the overlying soft tissues were anesthetized with 1% lidocaine with epinephrine. Ultrasound image documentation was performed. The microwire was kinked to measure appropriate catheter length. A stiff glidewire was advanced to the level of the IVC and the micropuncture sheath was exchanged for a peel-away sheath. A split tip tunneled hemodialysis catheter measuring 23 cm from tip to cuff was tunneled in a retrograde fashion from the anterior chest wall to the venotomy incision.  The catheter was then placed through the peel-away sheath with tips ultimately positioned  within the superior aspect of the right atrium. Final catheter positioning was confirmed and documented with a spot radiographic image. The catheter aspirates and flushes normally, with excellent flow confirmed. The catheter was flushed with appropriate volume heparin dwells.  The catheter exit site was secured with a 0-Prolene retention suture. The venotomy incision was closed with Dermabond and Steri-strips. Dressings were applied. The patient  tolerated the procedure well without immediate post procedural complication.  No complications were encountered.  No significant blood loss.  IMPRESSION: Status post placement of right IJ tunneled hemodialysis split tip catheter measuring 23 cm tip to cuff. Catheter ready for use.  Signed,  Dulcy Fanny. Earleen Newport, DO  [facility]  [facility]   Electronically Signed   By: Corrie Mckusick D.O.   On: 01/21/2015 10:07   Ir US Guide Vasc Access Right  01/21/2015   INDICATION: 39 year old male with a history of acute on chronic renal failure.  The patient has been referred for tunneled HD catheter placement.  EXAM: TUNNELED CENTRAL VENOUS HEMODIALYSIS CATHETER PLACEMENT WITH ULTRASOUND AND FLUOROSCOPIC GUIDANCE  MEDICATIONS: Ancef 2 gm IV; The IV antibiotic was given in an appropriate time interval prior to skin puncture.  CONTRAST:  None  ANESTHESIA/SEDATION: Versed 1.0 mg IV; Fentanyl 50 mcg IV  Total Moderate Sedation Time  Seventeen minutes.  FLUOROSCOPY TIME:  Zero minutes 48 seconds.  COMPLICATIONS: None  PROCEDURE: Informed written consent was obtained from the patient after a discussion of the risks, benefits, and alternatives to treatment. Questions regarding the procedure were encouraged and answered. The right neck and chest were prepped with chlorhexidine in a sterile fashion, and a sterile drape was applied covering the operative field. Maximum barrier sterile technique with sterile gowns and gloves were used for the procedure. A timeout was performed prior to the initiation of the procedure.  After creating a small venotomy incision, a micropuncture kit was utilized to access the right internal jugular vein under direct, real-time ultrasound guidance after the overlying soft tissues were anesthetized with 1% lidocaine with epinephrine. Ultrasound image documentation was performed. The microwire was kinked to measure appropriate catheter length. A stiff glidewire was  advanced to the level of the IVC and the micropuncture sheath was exchanged for a peel-away sheath. A split tip tunneled hemodialysis catheter measuring 23 cm from tip to cuff was tunneled in a retrograde fashion from the anterior chest wall to the venotomy incision.  The catheter was then placed through the peel-away sheath with tips ultimately positioned within the superior aspect of the right atrium. Final catheter positioning was confirmed and documented with a spot radiographic image. The catheter aspirates and flushes normally, with excellent flow confirmed. The catheter was flushed with appropriate volume heparin dwells.  The catheter exit site was secured with a 0-Prolene retention suture. The venotomy incision was closed with Dermabond and Steri-strips. Dressings were applied. The patient tolerated the procedure well without immediate post procedural complication.  No complications were encountered.  No significant blood loss.  IMPRESSION: Status post placement of right IJ tunneled hemodialysis split tip catheter measuring 23 cm tip to cuff. Catheter ready for use.  Signed,  Dulcy Fanny. Earleen Newport, DO  [facility]  [facility]   Electronically Signed   By: Corrie Mckusick D.O.   On: 01/21/2015 10:07      Medications:   Impression:  Principal Problem:   Acute renal failure superimposed on stage 4 chronic kidney disease Active Problems:   Hypertension  Diabetes   Leukocytosis   Bilateral pulmonary infiltrates on chest x-ray     Plan: Transfuse fourth unit PRBCs today  Consultants: Nephrology   Procedures   Antibiotics:                   Code Status:  Family Communication:  Spoke with sister in room  Disposition Plan issues PRBCs today fourth unit checked post hemoglobin and hematocrit dialysis today or tomorrow and discharge him  Time spent: 30 minutes   LOS: 5 days   Hildur Bayer M   01/23/2015, 7:40 AM

## 2015-01-23 NOTE — Progress Notes (Signed)
Subjective: Interval History: Patient is feeling much better. Presently he would like to go home. He denies any nausea or vomiting.  Objective: Vital signs in last 24 hours: Temp:  [97.9 F (36.6 C)-99.2 F (37.3 C)] 99.2 F (37.3 C) (03/29 0602) Pulse Rate:  [88-106] 90 (03/29 0833) Resp:  [18-20] 20 (03/29 0602) BP: (158-188)/(6-104) 178/88 mmHg (03/29 0833) SpO2:  [90 %-98 %] 97 % (03/29 0602) Weight:  [62 kg (136 lb 11 oz)-65.1 kg (143 lb 8.3 oz)] 62.1 kg (136 lb 14.5 oz) (03/29 0602) Weight change: -77.3 kg (-170 lb 6.7 oz)  Intake/Output from previous day: 03/28 0701 - 03/29 0700 In: 1055 [P.O.:720; Blood:335] Out: 5315 [Urine:2200] Intake/Output this shift:    General appearance: alert, cooperative and no distress Resp: wheezes bilaterally Cardio: regular rate and rhythm, S1, S2 normal, no murmur, click, rub or gallop GI: soft, non-tender; bowel sounds normal; no masses,  no organomegaly Extremities: edema 1+ edema  Lab Results:  Recent Labs  01/21/15 0441 01/22/15 0808  WBC  --  8.6  HGB 6.5* 7.5*  HCT 20.1* 23.3*  PLT  --  329   BMET:   Recent Labs  01/23/15 0629  NA 137  K 3.7  CL 96  CO2 29  GLUCOSE 167*  BUN 28*  CREATININE 5.61*  CALCIUM 8.3*   No results for input(s): PTH in the last 72 hours. Iron Studies: No results for input(s): IRON, TIBC, TRANSFERRIN, FERRITIN in the last 72 hours.  Studies/Results: Ir Fluoro Guide Cv Line Right  01/21/2015   INDICATION: 39 year old male with a history of acute on chronic renal failure.  The patient has been referred for tunneled HD catheter placement.  EXAM: TUNNELED CENTRAL VENOUS HEMODIALYSIS CATHETER PLACEMENT WITH ULTRASOUND AND FLUOROSCOPIC GUIDANCE  MEDICATIONS: Ancef 2 gm IV; The IV antibiotic was given in an appropriate time interval prior to skin puncture.  CONTRAST:  None  ANESTHESIA/SEDATION: Versed 1.0 mg IV; Fentanyl 50 mcg IV  Total Moderate Sedation Time  Seventeen minutes.  FLUOROSCOPY  TIME:  Zero minutes 48 seconds.  COMPLICATIONS: None  PROCEDURE: Informed written consent was obtained from the patient after a discussion of the risks, benefits, and alternatives to treatment. Questions regarding the procedure were encouraged and answered. The right neck and chest were prepped with chlorhexidine in a sterile fashion, and a sterile drape was applied covering the operative field. Maximum barrier sterile technique with sterile gowns and gloves were used for the procedure. A timeout was performed prior to the initiation of the procedure.  After creating a small venotomy incision, a micropuncture kit was utilized to access the right internal jugular vein under direct, real-time ultrasound guidance after the overlying soft tissues were anesthetized with 1% lidocaine with epinephrine. Ultrasound image documentation was performed. The microwire was kinked to measure appropriate catheter length. A stiff glidewire was advanced to the level of the IVC and the micropuncture sheath was exchanged for a peel-away sheath. A split tip tunneled hemodialysis catheter measuring 23 cm from tip to cuff was tunneled in a retrograde fashion from the anterior chest wall to the venotomy incision.  The catheter was then placed through the peel-away sheath with tips ultimately positioned within the superior aspect of the right atrium. Final catheter positioning was confirmed and documented with a spot radiographic image. The catheter aspirates and flushes normally, with excellent flow confirmed. The catheter was flushed with appropriate volume heparin dwells.  The catheter exit site was secured with a 0-Prolene retention suture. The venotomy  incision was closed with Dermabond and Steri-strips. Dressings were applied. The patient tolerated the procedure well without immediate post procedural complication.  No complications were encountered.  No significant blood loss.  IMPRESSION: Status post placement of right IJ tunneled  hemodialysis split tip catheter measuring 23 cm tip to cuff. Catheter ready for use.  Signed,  Dulcy Fanny. Earleen Newport, DO  [facility]  [facility]   Electronically Signed   By: Corrie Mckusick D.O.   On: 01/21/2015 10:07   Ir US Guide Vasc Access Right  01/21/2015   INDICATION: 39 year old male with a history of acute on chronic renal failure.  The patient has been referred for tunneled HD catheter placement.  EXAM: TUNNELED CENTRAL VENOUS HEMODIALYSIS CATHETER PLACEMENT WITH ULTRASOUND AND FLUOROSCOPIC GUIDANCE  MEDICATIONS: Ancef 2 gm IV; The IV antibiotic was given in an appropriate time interval prior to skin puncture.  CONTRAST:  None  ANESTHESIA/SEDATION: Versed 1.0 mg IV; Fentanyl 50 mcg IV  Total Moderate Sedation Time  Seventeen minutes.  FLUOROSCOPY TIME:  Zero minutes 48 seconds.  COMPLICATIONS: None  PROCEDURE: Informed written consent was obtained from the patient after a discussion of the risks, benefits, and alternatives to treatment. Questions regarding the procedure were encouraged and answered. The right neck and chest were prepped with chlorhexidine in a sterile fashion, and a sterile drape was applied covering the operative field. Maximum barrier sterile technique with sterile gowns and gloves were used for the procedure. A timeout was performed prior to the initiation of the procedure.  After creating a small venotomy incision, a micropuncture kit was utilized to access the right internal jugular vein under direct, real-time ultrasound guidance after the overlying soft tissues were anesthetized with 1% lidocaine with epinephrine. Ultrasound image documentation was performed. The microwire was kinked to measure appropriate catheter length. A stiff glidewire was advanced to the level of the IVC and the micropuncture sheath was exchanged for a peel-away sheath. A split tip tunneled hemodialysis catheter measuring 23 cm from tip to cuff was tunneled in a  retrograde fashion from the anterior chest wall to the venotomy incision.  The catheter was then placed through the peel-away sheath with tips ultimately positioned within the superior aspect of the right atrium. Final catheter positioning was confirmed and documented with a spot radiographic image. The catheter aspirates and flushes normally, with excellent flow confirmed. The catheter was flushed with appropriate volume heparin dwells.  The catheter exit site was secured with a 0-Prolene retention suture. The venotomy incision was closed with Dermabond and Steri-strips. Dressings were applied. The patient tolerated the procedure well without immediate post procedural complication.  No complications were encountered.  No significant blood loss.  IMPRESSION: Status post placement of right IJ tunneled hemodialysis split tip catheter measuring 23 cm tip to cuff. Catheter ready for use.  Signed,  Dulcy Fanny. Earleen Newport, DO  [facility]  [facility]   Electronically Signed   By: Corrie Mckusick D.O.   On: 01/21/2015 10:07    I have reviewed the patient's current medications.  Assessment/Plan: Problem #1 renal failure: Patient is started on dialysis. He was dialyzed yesterday. Presently he doesn't have any uremic signs and symptoms. Problem #2 hypertension: His blood pressure is reasonably controlled Problem #3 history of diabetes Problem #4 anemia: His hemoglobin and hematocrit is very low. Presently is below target goal. Patient has received blood transfusion. Problem #5 history of cough with bilateral pulmonary infiltrate. Patient presently denies any fever  chills or sweating. His cough is also much better. Problem #6 metabolic bone disease: His calcium is range however his phosphorus is high. Patient recently started on PhosLo. Problem #7 history of morbid obesity Problem #8 glaucoma Plan: We'll make arrangements for patient to get dialysis tomorrow. Once his PPD  is read and arrangement for outpatient dialysis at [facility] clinic is made patient could be discharged to be followed as an outpatient.   LOS: 5 days   Arden Tinoco S 01/23/2015,8:44 AM

## 2015-01-24 LAB — TYPE AND SCREEN
ABO/RH(D): A POS
Antibody Screen: NEGATIVE
UNIT DIVISION: 0
UNIT DIVISION: 0
UNIT DIVISION: 0
Unit division: 0
Unit division: 0

## 2015-01-24 LAB — GLUCOSE, CAPILLARY
GLUCOSE-CAPILLARY: 162 mg/dL — AB (ref 70–99)
Glucose-Capillary: 272 mg/dL — ABNORMAL HIGH (ref 70–99)

## 2015-01-24 LAB — HEMOGLOBIN AND HEMATOCRIT, BLOOD
HEMATOCRIT: 32.2 % — AB (ref 39.0–52.0)
Hemoglobin: 10.5 g/dL — ABNORMAL LOW (ref 13.0–17.0)

## 2015-01-24 MED ORDER — EPOETIN ALFA 10000 UNIT/ML IJ SOLN
INTRAMUSCULAR | Status: AC
  Administered 2015-01-24: 10000 [IU] via INTRAVENOUS
  Filled 2015-01-24: qty 1

## 2015-01-24 MED ORDER — TUBERCULIN PPD 5 UNIT/0.1ML ID SOLN
5.0000 [IU] | Freq: Once | INTRADERMAL | Status: DC
  Filled 2015-01-24: qty 0.1

## 2015-01-24 MED ORDER — SODIUM CHLORIDE 0.9 % IV SOLN: 100.0000 mL | INTRAVENOUS | Status: DC | PRN

## 2015-01-24 MED ORDER — SODIUM CHLORIDE 0.9 % IJ SOLN
INTRAMUSCULAR | Status: AC
  Administered 2015-01-24: 10 mL via INTRAVENOUS
  Filled 2015-01-24: qty 12

## 2015-01-24 MED ORDER — POLYSACCHARIDE IRON COMPLEX 150 MG PO CAPS: 150.0000 mg | ORAL_CAPSULE | Freq: Every day | ORAL | Status: AC

## 2015-01-24 MED ORDER — ALTEPLASE 2 MG IJ SOLR
2.0000 mg | Freq: Once | INTRAMUSCULAR | Status: DC | PRN
  Filled 2015-01-24: qty 2

## 2015-01-24 MED ORDER — HEPARIN SODIUM (PORCINE) 1000 UNIT/ML IJ SOLN
INTRAMUSCULAR | Status: AC
  Administered 2015-01-24: 4200 [IU] via INTRAVENOUS_CENTRAL
  Filled 2015-01-24: qty 6

## 2015-01-24 MED ORDER — CLONIDINE HCL 0.2 MG PO TABS: 0.2000 mg | ORAL_TABLET | Freq: Two times a day (BID) | ORAL | Status: AC

## 2015-01-24 NOTE — Discharge Summary (Signed)
Physician Discharge Summary  Donald Sampson:[id number] DOB: [birth year] DOA: 01/18/2015  PCP: [name], MD  Admit date: 01/18/2015 Discharge date: 01/24/2015   Recommendations for Outpatient Follow-up:  Patient is to follow-up with outpatient dialysis 3 times a week likewise to follow-up in my office in 4-5 days time to assess anemia hemodynamics blood pressure and glycemic control Discharge Diagnoses:  Principal Problem:   Acute renal failure superimposed on stage 4 chronic kidney disease Active Problems:   Hypertension   Diabetes   Leukocytosis   Bilateral pulmonary infiltrates on chest x-ray   Discharge Condition: Stable  Filed Weights   01/22/15 2300 01/23/15 0602 01/24/15 0500  Weight: 136 lb 11 oz (62 kg) 136 lb 14.5 oz (62.1 kg) 136 lb 7.4 oz (61.9 kg)    History of present illness:  Patient with long-standing history of chronic renal failure due to uncontrolled hypertension not controlled insulin-dependent diabetes chronic noncompliance with a cough increased dyspnea found to have some volume overload accelerated hypertension and a creatinine of 10 or 11. He refused dialysis initially and then relent recanted he had permacath placed underwent 3 episodes of dialysis he had likewise had concomitant anemia of chronic disease with concomitant iron deficiency placed on Niferex-150 by mouth daily and was given erythropoietin during dialysis he likewise was transfused 3 units of packed red blood cells due to chronic anemia and post discharge hemoglobin is pending this morning he had  Glycemic control and good antihypertensive control while in hospital he will follow-up my office within 4 days time as well as dialysis 3 times a week  Hospital Course:  See history of present illness  Procedures:  Permanent catheter placed. Hemodialysis performed transfused 3 units PRBCs  Consultations:  Nephrology  Discharge Instructions     Medication List    ASK  your doctor about these medications        amLODipine 5 MG tablet  Commonly known as:  NORVASC  Take 5 mg by mouth daily.     brimonidine 0.2 % ophthalmic solution  Commonly known as:  ALPHAGAN  Place 1 drop into the left eye 3 (three) times daily.     insulin aspart 100 UNIT/ML injection  Commonly known as:  novoLOG  Inject 15 Units into the skin 3 (three) times daily with meals.     insulin glargine 100 UNIT/ML injection  Commonly known as:  LANTUS  Inject 30 Units into the skin at bedtime.     linagliptin 5 MG Tabs tablet  Commonly known as:  TRADJENTA  Take 1 tablet (5 mg total) by mouth daily.       No Known Allergies    The results of significant diagnostics from this hospitalization (including imaging, microbiology, ancillary and laboratory) are listed below for reference.    Significant Diagnostic Studies: Dg Chest 2 View  01/18/2015   CLINICAL DATA:  Nonproductive cough. Fever and shortness of breath. Chest pain.  EXAM: CHEST  2 VIEW  COMPARISON:  Chest x-rays dated 09/17/2013 and 10/16/2012  FINDINGS: The patient has patchy infiltrates in both lower lobes. There is chronic lateral pleural thickening on the right. Heart size and pulmonary vascularity are normal.  On the lateral view there is increased density at the inferior aspect of the hilar regions which may be a summation shadow of the pulmonary infiltrates but follow-up is recommended.  No effusions.  No acute osseous abnormality.  IMPRESSION: New bilateral infiltrates at the lung bases. Follow-up is recommended to ensure clearing.  Electronically Signed   By: Lorriane Shire M.D.   On: 01/18/2015 21:05   US Renal  01/19/2015   CLINICAL DATA:  Acute renal failure and history of chronic kidney disease.  EXAM: RENAL/URINARY TRACT ULTRASOUND COMPLETE  COMPARISON:  Prior renal ultrasound on 11/08/2012  FINDINGS: Right Kidney:  Length: 14 cm. Although renal length is stable since the prior ultrasound, there clearly has  been some loss of cortical volume since the prior ultrasound. The renal cortex is mildly echogenic. No evidence of hydronephrosis. No focal masses identified.  Left Kidney:  Length: 13.7 cm. Similar findings as the right kidney with stable length but some interval loss of cortical volume since the prior ultrasound. Renal cortex is mildly echogenic. No evidence of hydronephrosis or focal lesion.  Bladder:  Appears normal for degree of bladder distention.  IMPRESSION: Some loss of renal cortical volume present bilaterally since prior ultrasound in 2014. No evidence of renal obstruction bilaterally.   Electronically Signed   By: Aletta Edouard M.D.   On: 01/19/2015 09:07   Ir Fluoro Guide Cv Line Right  01/21/2015   INDICATION: 39 year old male with a history of acute on chronic renal failure.  The patient has been referred for tunneled HD catheter placement.  EXAM: TUNNELED CENTRAL VENOUS HEMODIALYSIS CATHETER PLACEMENT WITH ULTRASOUND AND FLUOROSCOPIC GUIDANCE  MEDICATIONS: Ancef 2 gm IV; The IV antibiotic was given in an appropriate time interval prior to skin puncture.  CONTRAST:  None  ANESTHESIA/SEDATION: Versed 1.0 mg IV; Fentanyl 50 mcg IV  Total Moderate Sedation Time  Seventeen minutes.  FLUOROSCOPY TIME:  Zero minutes 48 seconds.  COMPLICATIONS: None  PROCEDURE: Informed written consent was obtained from the patient after a discussion of the risks, benefits, and alternatives to treatment. Questions regarding the procedure were encouraged and answered. The right neck and chest were prepped with chlorhexidine in a sterile fashion, and a sterile drape was applied covering the operative field. Maximum barrier sterile technique with sterile gowns and gloves were used for the procedure. A timeout was performed prior to the initiation of the procedure.  After creating a small venotomy incision, a micropuncture kit was utilized to access the right internal jugular vein under direct, real-time ultrasound guidance  after the overlying soft tissues were anesthetized with 1% lidocaine with epinephrine. Ultrasound image documentation was performed. The microwire was kinked to measure appropriate catheter length. A stiff glidewire was advanced to the level of the IVC and the micropuncture sheath was exchanged for a peel-away sheath. A split tip tunneled hemodialysis catheter measuring 23 cm from tip to cuff was tunneled in a retrograde fashion from the anterior chest wall to the venotomy incision.  The catheter was then placed through the peel-away sheath with tips ultimately positioned within the superior aspect of the right atrium. Final catheter positioning was confirmed and documented with a spot radiographic image. The catheter aspirates and flushes normally, with excellent flow confirmed. The catheter was flushed with appropriate volume heparin dwells.  The catheter exit site was secured with a 0-Prolene retention suture. The venotomy incision was closed with Dermabond and Steri-strips. Dressings were applied. The patient tolerated the procedure well without immediate post procedural complication.  No complications were encountered.  No significant blood loss.  IMPRESSION: Status post placement of right IJ tunneled hemodialysis split tip catheter measuring 23 cm tip to cuff. Catheter ready for use.  Signed,  Dulcy Fanny. Earleen Newport, DO  [facility]  [facility]   Electronically Signed  By: Corrie Mckusick D.O.   On: 01/21/2015 10:07   Ir US Guide Vasc Access Right  01/21/2015   INDICATION: 39 year old male with a history of acute on chronic renal failure.  The patient has been referred for tunneled HD catheter placement.  EXAM: TUNNELED CENTRAL VENOUS HEMODIALYSIS CATHETER PLACEMENT WITH ULTRASOUND AND FLUOROSCOPIC GUIDANCE  MEDICATIONS: Ancef 2 gm IV; The IV antibiotic was given in an appropriate time interval prior to skin puncture.  CONTRAST:  None  ANESTHESIA/SEDATION: Versed 1.0  mg IV; Fentanyl 50 mcg IV  Total Moderate Sedation Time  Seventeen minutes.  FLUOROSCOPY TIME:  Zero minutes 48 seconds.  COMPLICATIONS: None  PROCEDURE: Informed written consent was obtained from the patient after a discussion of the risks, benefits, and alternatives to treatment. Questions regarding the procedure were encouraged and answered. The right neck and chest were prepped with chlorhexidine in a sterile fashion, and a sterile drape was applied covering the operative field. Maximum barrier sterile technique with sterile gowns and gloves were used for the procedure. A timeout was performed prior to the initiation of the procedure.  After creating a small venotomy incision, a micropuncture kit was utilized to access the right internal jugular vein under direct, real-time ultrasound guidance after the overlying soft tissues were anesthetized with 1% lidocaine with epinephrine. Ultrasound image documentation was performed. The microwire was kinked to measure appropriate catheter length. A stiff glidewire was advanced to the level of the IVC and the micropuncture sheath was exchanged for a peel-away sheath. A split tip tunneled hemodialysis catheter measuring 23 cm from tip to cuff was tunneled in a retrograde fashion from the anterior chest wall to the venotomy incision.  The catheter was then placed through the peel-away sheath with tips ultimately positioned within the superior aspect of the right atrium. Final catheter positioning was confirmed and documented with a spot radiographic image. The catheter aspirates and flushes normally, with excellent flow confirmed. The catheter was flushed with appropriate volume heparin dwells.  The catheter exit site was secured with a 0-Prolene retention suture. The venotomy incision was closed with Dermabond and Steri-strips. Dressings were applied. The patient tolerated the procedure well without immediate post procedural complication.  No complications were  encountered.  No significant blood loss.  IMPRESSION: Status post placement of right IJ tunneled hemodialysis split tip catheter measuring 23 cm tip to cuff. Catheter ready for use.  Signed,  Dulcy Fanny. Earleen Newport, DO  [facility]  [facility]   Electronically Signed   By: Corrie Mckusick D.O.   On: 01/21/2015 10:07    Microbiology: Recent Results (from the past 240 hour(s))  MRSA PCR Screening     Status: None   Collection Time: 01/19/15  7:00 AM  Result Value Ref Range Status   MRSA by PCR NEGATIVE NEGATIVE Final    Comment:        The GeneXpert MRSA Assay (FDA approved for NASAL specimens only), is one component of a comprehensive MRSA colonization surveillance program. It is not intended to diagnose MRSA infection nor to guide or monitor treatment for MRSA infections.      Labs: Basic Metabolic Panel:  Recent Labs Lab 01/18/15 2110 01/19/15 0429 01/20/15 0445 01/23/15 0629  NA 137 133* 135 137  K 4.2 4.2 4.4 3.7  CL 94* 93* 95* 96  CO2 26 26 26 29   GLUCOSE 166* 275* 116* 167*  BUN 75* 78* 82* 28*  CREATININE 11.23* 10.87* 11.20* 5.61*  CALCIUM 6.5* 6.2*  6.2 6.8* 8.3*  PHOS  --  8.2*  --   --    Liver Function Tests:  Recent Labs Lab 01/18/15 2115  AST 31  ALT 25  ALKPHOS 65  BILITOT 0.6  PROT 7.7  ALBUMIN 2.9*   No results for input(s): LIPASE, AMYLASE in the last 168 hours. No results for input(s): AMMONIA in the last 168 hours. CBC:  Recent Labs Lab 01/18/15 2145 01/19/15 0429 01/19/15 0724 01/19/15 1455 01/20/15 0445 01/20/15 2336 01/21/15 0441 01/22/15 0808  WBC 12.1* 9.8 9.9 11.6*  --   --   --  8.6  NEUTROABS 8.9*  --   --   --   --   --   --  6.6  HGB 7.0* 6.1* 6.1* 6.6* 5.8* 7.1* 6.5* 7.5*  HCT 21.4* 19.1* 18.8* 20.0* 17.7* 22.0* 20.1* 23.3*  MCV 76.2* 76.4* 76.1* 76.3*  --   --   --  77.7*  PLT 186 193 178 231  --   --   --  329   Cardiac Enzymes:  Recent Labs Lab 01/18/15 2115  01/18/15 2302 01/19/15 0429 01/19/15 1029  TROPONINI 0.09* 0.09* 0.10* 0.14*   BNP: BNP (last 3 results)  Recent Labs  01/18/15 2302  BNP 100.0    ProBNP (last 3 results) No results for input(s): PROBNP in the last 8760 hours.  CBG:  Recent Labs Lab 01/22/15 2324 01/23/15 0728 01/23/15 1124 01/23/15 1616 01/23/15 2054  GLUCAP 105* 205* 302* 169* 243*       Signed:  Chauntel Windsor M  [facility] Pager: [phone] 01/24/2015, 6:55 AM

## 2015-01-24 NOTE — Clinical Social Work Note (Signed)
[facility] and [facility]. He is aware that his schedule will be Tuesday, Thursday, Saturday after that and clinic will let him know on Friday what time.   Derenda FennelKara Zakayla Martinec, [place] [phone]

## 2015-01-24 NOTE — Progress Notes (Addendum)
Discharge instruction and prescriptions given, verbalized understanding, out in stable condition via w/c with family.

## 2015-01-24 NOTE — Progress Notes (Signed)
CEBERT DETTMANN  MRN: [id number]  DOB/AGE: [birth year] 39 y.o.  Primary Care Physician:DONDIEGO,RICHARD M, MD  Admit date: 01/18/2015  Chief Complaint:  Chief Complaint  Patient presents with  . Cough    S-Pt presented on  01/18/2015 with  Chief Complaint  Patient presents with  . Cough  .     Pt offers no new complaints.     Pt understands the need for dialysis as pt mother is also on HD         Meds . sodium chloride   Intravenous Once  . sodium chloride   Intravenous Once  . amLODipine  5 mg Oral Daily  . azithromycin  500 mg Oral QHS  . brimonidine  1 drop Left Eye TID  . calcium carbonate  1,000 mg of elemental calcium Oral TID WC  . cefTRIAXone (ROCEPHIN)  IV  1 g Intravenous Q24H  . cloNIDine  0.2 mg Oral BID  . epoetin (EPOGEN/PROCRIT) injection  10,000 Units Intravenous Q M,W,F-HD  . furosemide  160 mg Intravenous BID  . heparin subcutaneous  5,000 Units Subcutaneous 3 times per day  . insulin aspart  0-9 Units Subcutaneous TID WC  . insulin aspart  3 Units Subcutaneous TID WC  . insulin glargine  20 Units Subcutaneous QHS  . iron polysaccharides  150 mg Oral Daily  . nitroGLYCERIN  0.5 inch Topical 4 times per day  . sodium chloride  3 mL Intravenous Q12H  . sodium chloride  3 mL Intravenous Q12H  . tuberculin  5 Units Intradermal Once      Physical Exam: Vital signs in last 24 hours: Temp:  [98.1 F (36.7 C)-99.5 F (37.5 C)] 99.5 F (37.5 C) (03/30 0601) Pulse Rate:  [84-94] 87 (03/30 0601) Resp:  [18-20] 20 (03/30 0601) BP: (136-186)/(76-103) 165/85 mmHg (03/30 0601) SpO2:  [97 %-100 %] 100 % (03/30 0601) Weight:  [136 lb 7.4 oz (61.9 kg)] 136 lb 7.4 oz (61.9 kg) (03/30 0500) Weight change: -7 lb 0.9 oz (-3.2 kg) Last BM Date: 01/22/15  Intake/Output from previous day: 03/29 0701 - 03/30 0700 In: 1743 [P.O.:960; I.V.:250; Blood:335; IV Piggyback:198] Out: 1650 [Urine:1650] Total I/O In: 66 [IV Piggyback:66] Out: 700  [Urine:700]   Physical Exam: General- pt is awake,alert, oriented to time place and person Resp- No acute REsp distress, decreased bs at bases CVS- S1S2 regular in rate and rhythm GIT- BS+, soft, NT, ND EXT-left BKA +, Trace edema right side   Lab Results: CBC  Recent Labs  01/22/15 0808 01/24/15 0726  WBC 8.6  --   HGB 7.5* 10.5*  HCT 23.3* 32.2*  PLT 329  --     BMET  Recent Labs  01/23/15 0629  NA 137  K 3.7  CL 96  CO2 29  GLUCOSE 167*  BUN 28*  CREATININE 5.61*  CALCIUM 8.3*   Creat trend 2016 11.2=>10.8=>11.2 2015 4.11 2014  7.25==>5.42             2.8--3.4  2013     3.28    MICRO Recent Results (from the past 240 hour(s))  MRSA PCR Screening     Status: None   Collection Time: 01/19/15  7:00 AM  Result Value Ref Range Status   MRSA by PCR NEGATIVE NEGATIVE Final    Comment:        The GeneXpert MRSA Assay (FDA approved for NASAL specimens only), is one component of a comprehensive MRSA colonization surveillance program. It is not intended  to diagnose MRSA infection nor to guide or monitor treatment for MRSA infections.       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                Progression of CKD marked with AKI                Proteinura  Present .                Strong family hx of ESRD                Strong hx of NON complaince               2)HTN Target Organ damage  CKD PVD  Medication- On Diuretics On Calcium Channel Blockers On Central sympatholytics   3)Anemia HGb NOT at goal (9--11) Iron deficiency and Chronic disease Chronic disease Pt  received PRBC  4)CKD Mineral-Bone Disorder PTH HIgh Secondary Hyperparathyroidism  present Phosphorus not at goal. Calcium not at goal  5)ID- Pneumoinia On ABX  6)Electrolytes  Normokalemic Hyponatremic- now better    7)Acid base Co2 at goal   8) Preventive care- ppd was not placed on 01/21/15.    To be placed today after communicating with Dialysis center to see if they would place it.     CXR already done.  Plan:  Will dialyze today  Shante Archambeault S 01/24/2015, 9:02 AM

## 2015-01-24 NOTE — Clinical Social Work Note (Signed)
[facility] pt [facility] not need [facility] for outpatient dialysis. Will fax additional notes from nephrologist today in order to set up dialysis.  Derenda FennelKara Jamy Whyte, [place] [phone]

## 2015-01-25 NOTE — Care Management Utilization Note (Signed)
UR completed 

## 2015-01-30 LAB — HEPATITIS B SURFACE ANTIBODY,QUALITATIVE: Hep B S Ab: NONREACTIVE

## 2015-01-30 LAB — HEPATITIS B CORE ANTIBODY, TOTAL: Hep B Core Total Ab: NEGATIVE

## 2015-06-19 ENCOUNTER — Ambulatory Visit
Admission: RE | Admit: 2015-06-19 | Discharge: 2015-06-19 | Disposition: A | Discharge status: Home / Self Care | Payer: Medicare Other | Source: Ambulatory Visit | Attending: Vascular Surgery | Admitting: Vascular Surgery

## 2015-06-19 ENCOUNTER — Encounter
Admission: RE | Admit: 2015-06-19 | Discharge: 2015-06-19 | Disposition: A | Discharge status: Home / Self Care | Payer: Medicare Other | Source: Ambulatory Visit | Attending: Vascular Surgery | Admitting: Vascular Surgery

## 2015-06-19 DIAGNOSIS — I10 Essential (primary) hypertension: Secondary | ICD-10-CM | POA: Insufficient documentation

## 2015-06-19 DIAGNOSIS — Z992 Dependence on renal dialysis: Secondary | ICD-10-CM | POA: Diagnosis not present

## 2015-06-19 HISTORY — DX: Chronic kidney disease, unspecified: N18.9

## 2015-06-19 HISTORY — DX: Reserved for inherently not codable concepts without codable children: IMO0001

## 2015-06-19 HISTORY — DX: Dependence on renal dialysis: Z99.2

## 2015-06-19 LAB — TYPE AND SCREEN
ABO/RH(D): A POS
Antibody Screen: NEGATIVE

## 2015-06-19 LAB — CBC
HEMATOCRIT: 33.6 % — AB (ref 40.0–52.0)
Hemoglobin: 10.8 g/dL — ABNORMAL LOW (ref 13.0–18.0)
MCH: 23.9 pg — ABNORMAL LOW (ref 26.0–34.0)
MCHC: 32.1 g/dL (ref 32.0–36.0)
MCV: 74.3 fL — ABNORMAL LOW (ref 80.0–100.0)
Platelets: 266 10*3/uL (ref 150–440)
RBC: 4.53 MIL/uL (ref 4.40–5.90)
RDW: 18 % — AB (ref 11.5–14.5)
WBC: 7.1 10*3/uL (ref 3.8–10.6)

## 2015-06-19 LAB — BASIC METABOLIC PANEL
ANION GAP: 14 (ref 5–15)
BUN: 50 mg/dL — ABNORMAL HIGH (ref 6–20)
CALCIUM: 8.5 mg/dL — AB (ref 8.9–10.3)
CO2: 21 mmol/L — AB (ref 22–32)
Chloride: 93 mmol/L — ABNORMAL LOW (ref 101–111)
Creatinine, Ser: 9.45 mg/dL — ABNORMAL HIGH (ref 0.61–1.24)
GFR, EST AFRICAN AMERICAN: 7 mL/min — AB (ref >60)
GFR, EST NON AFRICAN AMERICAN: 6 mL/min — AB (ref >60)
Glucose, Bld: 311 mg/dL — ABNORMAL HIGH (ref 65–99)
POTASSIUM: 4.1 mmol/L (ref 3.5–5.1)
Sodium: 128 mmol/L — ABNORMAL LOW (ref 135–145)

## 2015-06-19 LAB — ABO/RH: ABO/RH(D): A POS

## 2015-06-19 LAB — PROTIME-INR
INR: 0.95
Prothrombin Time: 12.9 seconds (ref 11.4–15.0)

## 2015-06-19 LAB — APTT: APTT: 23 s — AB (ref 24–36)

## 2015-06-19 NOTE — Patient Instructions (Addendum)
  Your procedure is scheduled on: June 27, 2015 (Wednesday) Report to Day Surgery. To find out your arrival time please call [phone] between 1PM - 3PM on June 26, 2015 (Tuesday).  Remember: Instructions that are not followed completely may result in serious medical risk, up to and including death, or upon the discretion of your surgeon and anesthesiologist your surgery may need to be rescheduled.    __x__ 1. Do not eat food or drink liquids after midnight. No gum chewing or hard candies.     ____ 2. No Alcohol for 24 hours before or after surgery.   ____ 3. Bring all medications with you on the day of surgery if instructed.    __x__ 4. Notify your doctor if there is any change in your medical condition     (cold, fever, infections).     Do not wear jewelry, make-up, hairpins, clips or nail polish.  Do not wear lotions, powders, or perfumes. You may wear deodorant.  Do not shave 48 hours prior to surgery. Men may shave face and neck.  Do not bring valuables to the hospital.    [facility] is not responsible for any belongings or valuables.               Contacts, dentures or bridgework may not be worn into surgery.  Leave your suitcase in the car. After surgery it may be brought to your room.  For patients admitted to the hospital, discharge time is determined by your                treatment team.   Patients discharged the day of surgery will not be allowed to drive home.   Please read over the following fact sheets that you were given:   Surgical Site Infection Prevention   __x__ Take these medicines the morning of surgery with A SIP OF WATER:    1. Amlodipine  2. Clonidine  3.   4.  5.  6.  ____ Fleet Enema (as directed)   __X__ Use CHG Soap as directed (SAGE WIPES)  ____ Use inhalers on the day of surgery  ____ Stop metformin 2 days prior to surgery    _x___ Take 1/2 of usual insulin dose the night before surgery and none on the morning of surgery. (TAKE  ONE-HALF OF LANTUS AT BEDTIME ON AUGUST 30) NO INSULIN THE MORNING OF SURGERY  ____ Stop Coumadin/Plavix/aspirin on   ____ Stop Anti-inflammatories on    ____ Stop supplements until after surgery.    ____ Bring C-Pap to the hospital.

## 2015-06-27 ENCOUNTER — Encounter: Payer: Self-pay | Admitting: *Deleted

## 2015-06-27 ENCOUNTER — Ambulatory Visit
Admission: RE | Admit: 2015-06-27 | Discharge: 2015-06-27 | Disposition: A | Discharge status: Home / Self Care | Payer: Medicare Other | Source: Ambulatory Visit | Attending: Vascular Surgery | Admitting: Vascular Surgery

## 2015-06-27 ENCOUNTER — Ambulatory Visit: Payer: Medicare Other | Admitting: Anesthesiology

## 2015-06-27 ENCOUNTER — Encounter
Admission: RE | Disposition: A | Discharge status: Home / Self Care | Payer: Self-pay | Source: Ambulatory Visit | Attending: Vascular Surgery

## 2015-06-27 DIAGNOSIS — Z841 Family history of disorders of kidney and ureter: Secondary | ICD-10-CM | POA: Insufficient documentation

## 2015-06-27 DIAGNOSIS — Z833 Family history of diabetes mellitus: Secondary | ICD-10-CM | POA: Diagnosis not present

## 2015-06-27 DIAGNOSIS — I120 Hypertensive chronic kidney disease with stage 5 chronic kidney disease or end stage renal disease: Secondary | ICD-10-CM | POA: Insufficient documentation

## 2015-06-27 DIAGNOSIS — E1122 Type 2 diabetes mellitus with diabetic chronic kidney disease: Secondary | ICD-10-CM | POA: Diagnosis not present

## 2015-06-27 DIAGNOSIS — D631 Anemia in chronic kidney disease: Secondary | ICD-10-CM | POA: Insufficient documentation

## 2015-06-27 DIAGNOSIS — N186 End stage renal disease: Secondary | ICD-10-CM | POA: Insufficient documentation

## 2015-06-27 DIAGNOSIS — R0602 Shortness of breath: Secondary | ICD-10-CM | POA: Diagnosis not present

## 2015-06-27 DIAGNOSIS — Z79899 Other long term (current) drug therapy: Secondary | ICD-10-CM | POA: Insufficient documentation

## 2015-06-27 DIAGNOSIS — Z8249 Family history of ischemic heart disease and other diseases of the circulatory system: Secondary | ICD-10-CM | POA: Insufficient documentation

## 2015-06-27 DIAGNOSIS — Z794 Long term (current) use of insulin: Secondary | ICD-10-CM | POA: Insufficient documentation

## 2015-06-27 HISTORY — PX: AV FISTULA PLACEMENT: SHX1204

## 2015-06-27 LAB — GLUCOSE, CAPILLARY
GLUCOSE-CAPILLARY: 344 mg/dL — AB (ref 65–99)
Glucose-Capillary: 335 mg/dL — ABNORMAL HIGH (ref 65–99)

## 2015-06-27 LAB — POTASSIUM: Potassium: 5 mmol/L (ref 3.5–5.1)

## 2015-06-27 SURGERY — ARTERIOVENOUS (AV) FISTULA CREATION
Anesthesia: General | Laterality: Left | Wound class: Clean

## 2015-06-27 MED ORDER — FAMOTIDINE 20 MG PO TABS
ORAL_TABLET | ORAL | Status: AC
  Administered 2015-06-27: 20 mg via ORAL
  Filled 2015-06-27: qty 1

## 2015-06-27 MED ORDER — CEFAZOLIN SODIUM 1-5 GM-% IV SOLN: 1.0000 g | Freq: Once | INTRAVENOUS | Status: DC

## 2015-06-27 MED ORDER — LIDOCAINE HCL (CARDIAC) 20 MG/ML IV SOLN
INTRAVENOUS | Status: DC | PRN
  Administered 2015-06-27: 100 mg via INTRAVENOUS

## 2015-06-27 MED ORDER — BUPIVACAINE HCL (PF) 0.5 % IJ SOLN
INTRAMUSCULAR | Status: DC | PRN
  Administered 2015-06-27: 4 mL

## 2015-06-27 MED ORDER — FENTANYL CITRATE (PF) 100 MCG/2ML IJ SOLN: 25.0000 ug | INTRAMUSCULAR | Status: DC | PRN

## 2015-06-27 MED ORDER — LABETALOL HCL 5 MG/ML IV SOLN: 10.0000 mg | INTRAVENOUS | Status: DC | PRN

## 2015-06-27 MED ORDER — LABETALOL HCL 5 MG/ML IV SOLN
INTRAVENOUS | Status: AC
  Administered 2015-06-27: 5 mg
  Filled 2015-06-27: qty 4

## 2015-06-27 MED ORDER — PROPOFOL 10 MG/ML IV BOLUS
INTRAVENOUS | Status: DC | PRN
  Administered 2015-06-27: 150 mg via INTRAVENOUS

## 2015-06-27 MED ORDER — FAMOTIDINE 20 MG PO TABS
20.0000 mg | ORAL_TABLET | Freq: Once | ORAL | Status: AC
  Administered 2015-06-27: 20 mg via ORAL

## 2015-06-27 MED ORDER — BUPIVACAINE HCL (PF) 0.5 % IJ SOLN
INTRAMUSCULAR | Status: AC
  Filled 2015-06-27: qty 30

## 2015-06-27 MED ORDER — ONDANSETRON HCL 4 MG/2ML IJ SOLN
INTRAMUSCULAR | Status: DC | PRN
  Administered 2015-06-27: 4 mg via INTRAVENOUS

## 2015-06-27 MED ORDER — ONDANSETRON HCL 4 MG/2ML IJ SOLN: 4.0000 mg | Freq: Once | INTRAMUSCULAR | Status: DC | PRN

## 2015-06-27 MED ORDER — SODIUM CHLORIDE 0.9 % IV SOLN
INTRAVENOUS | Status: DC
  Administered 2015-06-27 (×2): via INTRAVENOUS

## 2015-06-27 MED ORDER — GLYCOPYRROLATE 0.2 MG/ML IJ SOLN
INTRAMUSCULAR | Status: DC | PRN
  Administered 2015-06-27: 0.2 mg via INTRAVENOUS

## 2015-06-27 MED ORDER — HEPARIN SODIUM (PORCINE) 5000 UNIT/ML IJ SOLN
INTRAMUSCULAR | Status: AC
  Filled 2015-06-27: qty 1

## 2015-06-27 MED ORDER — CEFAZOLIN SODIUM 1-5 GM-% IV SOLN
INTRAVENOUS | Status: AC
  Administered 2015-06-27: 1 g via INTRAVENOUS
  Filled 2015-06-27: qty 50

## 2015-06-27 MED ORDER — PAPAVERINE HCL 30 MG/ML IJ SOLN
INTRAMUSCULAR | Status: AC
  Filled 2015-06-27: qty 2

## 2015-06-27 MED ORDER — FENTANYL CITRATE (PF) 100 MCG/2ML IJ SOLN
INTRAMUSCULAR | Status: DC | PRN
  Administered 2015-06-27 (×3): 25 ug via INTRAVENOUS

## 2015-06-27 MED ORDER — MIDAZOLAM HCL 2 MG/2ML IJ SOLN
INTRAMUSCULAR | Status: DC | PRN
  Administered 2015-06-27 (×2): 1 mg via INTRAVENOUS

## 2015-06-27 MED ORDER — SODIUM CHLORIDE 0.9 % IV SOLN
INTRAVENOUS | Status: DC | PRN
  Administered 2015-06-27: 20 mL via INTRAMUSCULAR

## 2015-06-27 SURGICAL SUPPLY — 51 items
APPLIER CLIP 11 MED OPEN (CLIP)
APPLIER CLIP 9.375 SM OPEN (CLIP)
BAG DECANTER FOR FLEXI CONT (MISCELLANEOUS) ×3 IMPLANT
BLADE SURG SZ11 CARB STEEL (BLADE) ×3 IMPLANT
BOOT SUTURE AID YELLOW STND (SUTURE) ×3 IMPLANT
BRUSH SCRUB 4% CHG (MISCELLANEOUS) ×3 IMPLANT
CANISTER SUCT 1200ML W/VALVE (MISCELLANEOUS) ×3 IMPLANT
CHLORAPREP W/TINT 26ML (MISCELLANEOUS) ×3 IMPLANT
CLIP APPLIE 11 MED OPEN (CLIP) IMPLANT
CLIP APPLIE 9.375 SM OPEN (CLIP) IMPLANT
ELECT CAUTERY BLADE 6.4 (BLADE) ×3 IMPLANT
GEL ULTRASOUND 20GR AQUASONIC (MISCELLANEOUS) IMPLANT
GLOVE SURG SYN 8.0 (GLOVE) ×9 IMPLANT
GOWN STRL REUS W/ TWL LRG LVL3 (GOWN DISPOSABLE) ×1 IMPLANT
GOWN STRL REUS W/ TWL XL LVL3 (GOWN DISPOSABLE) ×1 IMPLANT
GOWN STRL REUS W/TWL LRG LVL3 (GOWN DISPOSABLE) ×2
GOWN STRL REUS W/TWL XL LVL3 (GOWN DISPOSABLE) ×2
HEMOSTAT SURGICEL 2X3 (HEMOSTASIS) ×3 IMPLANT
IV NS 500ML (IV SOLUTION) ×2
IV NS 500ML BAXH (IV SOLUTION) ×1 IMPLANT
KIT RM TURNOVER STRD PROC AR (KITS) ×3 IMPLANT
LABEL OR SOLS (LABEL) ×3 IMPLANT
LIQUID BAND (GAUZE/BANDAGES/DRESSINGS) ×3 IMPLANT
LOOP RED MAXI  1X406MM (MISCELLANEOUS) ×2
LOOP VESSEL MAXI 1X406 RED (MISCELLANEOUS) ×1 IMPLANT
LOOP VESSEL MINI 0.8X406 BLUE (MISCELLANEOUS) ×2 IMPLANT
LOOPS BLUE MINI 0.8X406MM (MISCELLANEOUS) ×4
NEEDLE FILTER BLUNT 18X 1/2SAF (NEEDLE) ×2
NEEDLE FILTER BLUNT 18X1 1/2 (NEEDLE) ×1 IMPLANT
NEEDLE HYPO 30X.5 LL (NEEDLE) IMPLANT
NS IRRIG 500ML POUR BTL (IV SOLUTION) ×3 IMPLANT
PACK EXTREMITY ARMC (MISCELLANEOUS) ×3 IMPLANT
PAD GROUND ADULT SPLIT (MISCELLANEOUS) ×3 IMPLANT
PAD PREP 24X41 OB/GYN DISP (PERSONAL CARE ITEMS) ×3 IMPLANT
PUNCH SURGICAL ROTATE 2.7MM (MISCELLANEOUS) IMPLANT
SLEEVE PROTECTION STRL DISP (MISCELLANEOUS) ×3 IMPLANT
STOCKINETTE STRL 4IN 9604848 (GAUZE/BANDAGES/DRESSINGS) ×3 IMPLANT
SUT MNCRL+ 5-0 UNDYED PC-3 (SUTURE) ×1 IMPLANT
SUT MONOCRYL 5-0 (SUTURE) ×2
SUT PROLENE 6 0 BV (SUTURE) ×12 IMPLANT
SUT SILK 2 0 (SUTURE) ×2
SUT SILK 2-0 18XBRD TIE 12 (SUTURE) ×1 IMPLANT
SUT SILK 3 0 (SUTURE) ×2
SUT SILK 3-0 18XBRD TIE 12 (SUTURE) ×1 IMPLANT
SUT SILK 4 0 (SUTURE) ×2
SUT SILK 4-0 18XBRD TIE 12 (SUTURE) ×1 IMPLANT
SUT VIC AB 3-0 SH 27 (SUTURE) ×2
SUT VIC AB 3-0 SH 27X BRD (SUTURE) ×1 IMPLANT
SYR 20CC LL (SYRINGE) ×3 IMPLANT
SYR 3ML LL SCALE MARK (SYRINGE) ×3 IMPLANT
TOWEL OR 17X26 4PK STRL BLUE (TOWEL DISPOSABLE) IMPLANT

## 2015-06-27 NOTE — Anesthesia Preprocedure Evaluation (Addendum)
Anesthesia Evaluation  Patient identified by MRN, date of birth, ID band Patient awake    Reviewed: Allergy & Precautions, NPO status , Patient's Chart, lab work & pertinent test results  Airway Mallampati: IV  TM Distance: >3 FB Neck ROM: Full    Dental  (+) Teeth Intact   Pulmonary shortness of breath and with exertion,    Pulmonary exam normal       Cardiovascular Exercise Tolerance: Poor hypertension, Normal cardiovascular exam    Neuro/Psych    GI/Hepatic   Endo/Other  diabetes, Poorly Controlled, Type 2  Renal/GU ESRFRenal disease     Musculoskeletal   Abdominal   Peds  Hematology  (+) anemia ,   Anesthesia Other Findings   Reproductive/Obstetrics                            Anesthesia Physical Anesthesia Plan  ASA: IV  Anesthesia Plan: General   Post-op Pain Management:    Induction: Intravenous  Airway Management Planned: LMA  Additional Equipment:   Intra-op Plan:   Post-operative Plan: Extubation in OR  Informed Consent: I have reviewed the patients History and Physical, chart, labs and discussed the procedure including the risks, benefits and alternatives for the proposed anesthesia with the patient or authorized representative who has indicated his/her understanding and acceptance.     Plan Discussed with: CRNA  Anesthesia Plan Comments:         Anesthesia Quick Evaluation

## 2015-06-27 NOTE — Transfer of Care (Signed)
Immediate Anesthesia Transfer of Care Note  Patient: Donald Sampson  Procedure(s) Performed: Procedure(s): RADIAL CEPHALIC ARTERIOVENOUS (AV) FISTULA CREATION (Left)  Patient Location: PACU  Anesthesia Type:General  Level of Consciousness: sedated  Airway & Oxygen Therapy: Patient connected to nasal cannula oxygen  Post-op Assessment: Report given to RN, Post -op Vital signs reviewed and stable and Patient moving all extremities X 4  Post vital signs: Reviewed and stable  Last Vitals:  Filed Vitals:   06/27/15 0916  BP: 177/102  Pulse: 71  Temp:   Resp: 14    Complications: No apparent anesthesia complications

## 2015-06-27 NOTE — Anesthesia Postprocedure Evaluation (Signed)
  Anesthesia Post-op Note  Patient: Donald Sampson  Procedure(s) Performed: Procedure(s): RADIAL CEPHALIC ARTERIOVENOUS (AV) FISTULA CREATION (Left)  Anesthesia type:General  Patient location: PACU  Post pain: Pain level controlled  Post assessment: Post-op Vital signs reviewed, Patient's Cardiovascular Status Stable, Respiratory Function Stable, Patent Airway and No signs of Nausea or vomiting  Post vital signs: Reviewed and stable  Last Vitals:  Filed Vitals:   06/27/15 1059  BP: 144/86  Pulse:   Temp:   Resp:     Level of consciousness: awake, alert  and patient cooperative  Complications: No apparent anesthesia complications

## 2015-06-27 NOTE — Anesthesia Procedure Notes (Signed)
Procedure Name: LMA Insertion Date/Time: 06/27/2015 7:38 AM Performed by: Peyton Najjar Pre-anesthesia Checklist: Patient identified, Emergency Drugs available, Suction available, Patient being monitored and Timeout performed Patient Re-evaluated:Patient Re-evaluated prior to inductionOxygen Delivery Method: Circle system utilized Preoxygenation: Pre-oxygenation with 100% oxygen Intubation Type: IV induction LMA: LMA inserted LMA Size: 4.5

## 2015-06-27 NOTE — Op Note (Signed)
     OPERATIVE NOTE   PROCEDURE: left radiocephalic arteriovenous fistula placement  PRE-OPERATIVE DIAGNOSIS: End Stage Renal Disease  POST-OPERATIVE DIAGNOSIS: End Stage Renal Disease  SURGEON: Yitzchok Carriger, [name]  ASSISTANT(S): None  ANESTHESIA: general  ESTIMATED BLOOD LOSS: <50 cc  FINDING(S): Cephalic vein noted appears to be of marginal quality  SPECIMEN(S):  none  INDICATIONS:   Donald Sampson is a 39 y.o. male who presents with end stage renal disease.  The patient is scheduled for left brachiocephalic arteriovenous fistula placement.  The patient is aware the risks include but are not limited to: bleeding, infection, steal syndrome, nerve damage, ischemic monomelic neuropathy, failure to mature, and need for additional procedures.  The patient is aware of the risks of the procedure and elects to proceed forward.  DESCRIPTION: After full informed written consent was obtained from the patient, the patient was brought back to the operating room and placed supine upon the operating table.  Prior to induction, the patient received IV antibiotics.   After obtaining adequate anesthesia, the patient was then prepped and draped in the standard fashion for a left arm access procedure.    A linear incision was then created midway between the radial impulse and the cephalic vein. The cephalic vein was then identified and dissected circumferentially. It was marked with a surgical marker.    Attention was then turned to the radial artery which was exposed through the same incision and looped proximally and distally. Side branches were controlled with 4-0 silk ties.  The distal segment of the vein was ligated with a  2-0 silk, and the vein was transected.  The proximal segment was interrogated with serial dilators.  The vein accepted up to a 3.0 mm dilator without any difficulty. Heparinized saline was infused into the vein and clamped it with a small bulldog.  At this point, I reset  my exposure of the brachial artery and controlled the artery with vessel loops proximally and distally.  An arteriotomy was then made with a #11 blade, and extended with a Potts scissor.  Heparinized saline was injected proximal and distal into the radial artery.  The vein was then approximated to the artery while the artery was in its native bed and subsequently the vein was beveled using Potts scissors. The vein was then sewn to the artery in an end-to-side configuration with a running stitch of 6-0 Prolene.  Prior to completing this anastomosis Flushing maneuvers were performed and the artery was allowed to forward and back bleed.  There was no evidence of clot from any vessels.  I completed the anastomosis in the usual fashion and then released all vessel loops and clamps.    There was good  thrill in the venous outflow, and there was 1+ palpable radial pulse.  At this point, I irrigated out the surgical wound.  There was no further active bleeding.  The subcutaneous tissue was reapproximated with a running stitch of 3-0 Vicryl.  The skin was then reapproximated with a running subcuticular stitch of 4-0 Vicryl.  The skin was then cleaned, dried, and reinforced with Dermabond.    The patient tolerated this procedure well.   COMPLICATIONS: None  CONDITION: [facility]  Office: [phone]   06/27/2015, 10:31 AM

## 2015-06-27 NOTE — Discharge Instructions (Signed)

## 2015-06-27 NOTE — H&P (Signed)
[facility] History & Physical Update  The patient was interviewed and re-examined.  The patient's previous History and Physical has been reviewed and is unchanged.  There is no change in the plan of care. We plan to proceed with the scheduled procedure.  I have personally reviewed the preoperative vein mapping today.  He has a left cephalic vein that is 3.1 mm by ultrasound at the left wrist.  The plan is for a left radial cephalic fistula.  [name], [name], MD  06/27/2015, 7:21 AM

## 2015-11-12 ENCOUNTER — Inpatient Hospital Stay (HOSPITAL_COMMUNITY)
Admission: EM | Admit: 2015-11-12 | Discharge: 2015-11-14 | DRG: 189 | Disposition: A | Discharge status: Home / Self Care | Payer: Medicare Other | Attending: Family Medicine | Admitting: Family Medicine

## 2015-11-12 ENCOUNTER — Emergency Department (HOSPITAL_COMMUNITY): Payer: Medicare Other

## 2015-11-12 ENCOUNTER — Encounter (HOSPITAL_COMMUNITY): Payer: Self-pay | Admitting: Emergency Medicine

## 2015-11-12 DIAGNOSIS — I10 Essential (primary) hypertension: Secondary | ICD-10-CM

## 2015-11-12 DIAGNOSIS — Z89512 Acquired absence of left leg below knee: Secondary | ICD-10-CM

## 2015-11-12 DIAGNOSIS — E1139 Type 2 diabetes mellitus with other diabetic ophthalmic complication: Secondary | ICD-10-CM | POA: Diagnosis present

## 2015-11-12 DIAGNOSIS — Z8249 Family history of ischemic heart disease and other diseases of the circulatory system: Secondary | ICD-10-CM

## 2015-11-12 DIAGNOSIS — R918 Other nonspecific abnormal finding of lung field: Secondary | ICD-10-CM

## 2015-11-12 DIAGNOSIS — Z9119 Patient's noncompliance with other medical treatment and regimen: Secondary | ICD-10-CM

## 2015-11-12 DIAGNOSIS — E1122 Type 2 diabetes mellitus with diabetic chronic kidney disease: Secondary | ICD-10-CM | POA: Diagnosis present

## 2015-11-12 DIAGNOSIS — R0603 Acute respiratory distress: Secondary | ICD-10-CM | POA: Diagnosis present

## 2015-11-12 DIAGNOSIS — E8770 Fluid overload, unspecified: Secondary | ICD-10-CM

## 2015-11-12 DIAGNOSIS — N186 End stage renal disease: Secondary | ICD-10-CM | POA: Diagnosis present

## 2015-11-12 DIAGNOSIS — J810 Acute pulmonary edema: Secondary | ICD-10-CM | POA: Diagnosis present

## 2015-11-12 DIAGNOSIS — E669 Obesity, unspecified: Secondary | ICD-10-CM | POA: Diagnosis present

## 2015-11-12 DIAGNOSIS — I120 Hypertensive chronic kidney disease with stage 5 chronic kidney disease or end stage renal disease: Secondary | ICD-10-CM | POA: Diagnosis present

## 2015-11-12 DIAGNOSIS — E871 Hypo-osmolality and hyponatremia: Secondary | ICD-10-CM

## 2015-11-12 DIAGNOSIS — H540 Blindness, both eyes: Secondary | ICD-10-CM | POA: Diagnosis present

## 2015-11-12 DIAGNOSIS — J9601 Acute respiratory failure with hypoxia: Secondary | ICD-10-CM | POA: Diagnosis present

## 2015-11-12 DIAGNOSIS — Z794 Long term (current) use of insulin: Secondary | ICD-10-CM

## 2015-11-12 DIAGNOSIS — J189 Pneumonia, unspecified organism: Secondary | ICD-10-CM

## 2015-11-12 DIAGNOSIS — Z833 Family history of diabetes mellitus: Secondary | ICD-10-CM | POA: Diagnosis not present

## 2015-11-12 DIAGNOSIS — Z6836 Body mass index (BMI) 36.0-36.9, adult: Secondary | ICD-10-CM

## 2015-11-12 DIAGNOSIS — D890 Polyclonal hypergammaglobulinemia: Secondary | ICD-10-CM

## 2015-11-12 DIAGNOSIS — D649 Anemia, unspecified: Secondary | ICD-10-CM | POA: Diagnosis present

## 2015-11-12 DIAGNOSIS — H409 Unspecified glaucoma: Secondary | ICD-10-CM | POA: Diagnosis present

## 2015-11-12 DIAGNOSIS — E875 Hyperkalemia: Secondary | ICD-10-CM

## 2015-11-12 DIAGNOSIS — Z79891 Long term (current) use of opiate analgesic: Secondary | ICD-10-CM

## 2015-11-12 DIAGNOSIS — J9600 Acute respiratory failure, unspecified whether with hypoxia or hypercapnia: Secondary | ICD-10-CM | POA: Diagnosis present

## 2015-11-12 DIAGNOSIS — D509 Iron deficiency anemia, unspecified: Secondary | ICD-10-CM

## 2015-11-12 DIAGNOSIS — E0800 Diabetes mellitus due to underlying condition with hyperosmolarity without nonketotic hyperglycemic-hyperosmolar coma (NKHHC): Secondary | ICD-10-CM

## 2015-11-12 DIAGNOSIS — Z992 Dependence on renal dialysis: Secondary | ICD-10-CM | POA: Diagnosis not present

## 2015-11-12 LAB — CBC WITH DIFFERENTIAL/PLATELET
Basophils Absolute: 0 10*3/uL (ref 0.0–0.1)
Basophils Relative: 0 %
Eosinophils Absolute: 0.2 10*3/uL (ref 0.0–0.7)
Eosinophils Relative: 2 %
HCT: 35.5 % — ABNORMAL LOW (ref 39.0–52.0)
Hemoglobin: 11.1 g/dL — ABNORMAL LOW (ref 13.0–17.0)
Lymphocytes Relative: 9 %
Lymphs Abs: 1.2 10*3/uL (ref 0.7–4.0)
MCH: 24.1 pg — ABNORMAL LOW (ref 26.0–34.0)
MCHC: 31.3 g/dL (ref 30.0–36.0)
MCV: 77.2 fL — ABNORMAL LOW (ref 78.0–100.0)
Monocytes Absolute: 0.7 10*3/uL (ref 0.1–1.0)
Monocytes Relative: 6 %
Neutro Abs: 10.5 10*3/uL — ABNORMAL HIGH (ref 1.7–7.7)
Neutrophils Relative %: 83 %
Platelets: 256 10*3/uL (ref 150–400)
RBC: 4.6 MIL/uL (ref 4.22–5.81)
RDW: 17.4 % — ABNORMAL HIGH (ref 11.5–15.5)
WBC: 12.7 10*3/uL — ABNORMAL HIGH (ref 4.0–10.5)

## 2015-11-12 LAB — TROPONIN I: Troponin I: 0.04 ng/mL — ABNORMAL HIGH (ref <0.031)

## 2015-11-12 LAB — BRAIN NATRIURETIC PEPTIDE: B Natriuretic Peptide: 877 pg/mL — ABNORMAL HIGH (ref 0.0–100.0)

## 2015-11-12 LAB — BASIC METABOLIC PANEL
Anion gap: 16 — ABNORMAL HIGH (ref 5–15)
BUN: 63 mg/dL — ABNORMAL HIGH (ref 6–20)
CO2: 24 mmol/L (ref 22–32)
Calcium: 7.8 mg/dL — ABNORMAL LOW (ref 8.9–10.3)
Chloride: 96 mmol/L — ABNORMAL LOW (ref 101–111)
Creatinine, Ser: 12.93 mg/dL — ABNORMAL HIGH (ref 0.61–1.24)
GFR calc Af Amer: 5 mL/min — ABNORMAL LOW (ref >60)
GFR calc non Af Amer: 4 mL/min — ABNORMAL LOW (ref >60)
Glucose, Bld: 213 mg/dL — ABNORMAL HIGH (ref 65–99)
Potassium: 6.6 mmol/L (ref 3.5–5.1)
Sodium: 136 mmol/L (ref 135–145)

## 2015-11-12 LAB — GLUCOSE, CAPILLARY: Glucose-Capillary: 160 mg/dL — ABNORMAL HIGH (ref 65–99)

## 2015-11-12 MED ORDER — LABETALOL HCL 200 MG PO TABS
200.0000 mg | ORAL_TABLET | Freq: Two times a day (BID) | ORAL | Status: DC
  Administered 2015-11-13 – 2015-11-14 (×3): 200 mg via ORAL
  Filled 2015-11-12 (×3): qty 1

## 2015-11-12 MED ORDER — ALTEPLASE 2 MG IJ SOLR
2.0000 mg | Freq: Once | INTRAMUSCULAR | Status: DC | PRN
  Filled 2015-11-12: qty 2

## 2015-11-12 MED ORDER — ONDANSETRON HCL 4 MG/2ML IJ SOLN: 4.0000 mg | Freq: Four times a day (QID) | INTRAMUSCULAR | Status: DC | PRN

## 2015-11-12 MED ORDER — ALBUTEROL SULFATE (2.5 MG/3ML) 0.083% IN NEBU
5.0000 mg | INHALATION_SOLUTION | Freq: Once | RESPIRATORY_TRACT | Status: AC
  Administered 2015-11-12: 5 mg via RESPIRATORY_TRACT
  Filled 2015-11-12: qty 6

## 2015-11-12 MED ORDER — INSULIN ASPART 100 UNIT/ML ~~LOC~~ SOLN
0.0000 [IU] | Freq: Three times a day (TID) | SUBCUTANEOUS | Status: DC
  Administered 2015-11-13: 2 [IU] via SUBCUTANEOUS
  Administered 2015-11-13 – 2015-11-14 (×2): 1 [IU] via SUBCUTANEOUS

## 2015-11-12 MED ORDER — SODIUM CHLORIDE 0.9 % IV SOLN
1.0000 g | Freq: Once | INTRAVENOUS | Status: AC
  Administered 2015-11-12: 1 g via INTRAVENOUS
  Filled 2015-11-12: qty 10

## 2015-11-12 MED ORDER — POLYSACCHARIDE IRON COMPLEX 150 MG PO CAPS
150.0000 mg | ORAL_CAPSULE | Freq: Every day | ORAL | Status: DC
  Administered 2015-11-13 – 2015-11-14 (×2): 150 mg via ORAL
  Filled 2015-11-12 (×2): qty 1

## 2015-11-12 MED ORDER — SODIUM CHLORIDE 0.9 % IV SOLN: 100.0000 mL | INTRAVENOUS | Status: DC | PRN

## 2015-11-12 MED ORDER — LIDOCAINE HCL (PF) 1 % IJ SOLN: 5.0000 mL | INTRAMUSCULAR | Status: DC | PRN

## 2015-11-12 MED ORDER — INSULIN ASPART 100 UNIT/ML ~~LOC~~ SOLN
10.0000 [IU] | Freq: Once | SUBCUTANEOUS | Status: AC
  Administered 2015-11-12: 10 [IU] via INTRAVENOUS
  Filled 2015-11-12: qty 1

## 2015-11-12 MED ORDER — LIDOCAINE-PRILOCAINE 2.5-2.5 % EX CREA: 1.0000 "application " | TOPICAL_CREAM | CUTANEOUS | Status: DC | PRN

## 2015-11-12 MED ORDER — DEXTROSE 50 % IV SOLN
12.5000 g | Freq: Once | INTRAVENOUS | Status: AC
  Administered 2015-11-12: 12.5 g via INTRAVENOUS

## 2015-11-12 MED ORDER — INSULIN ASPART 100 UNIT/ML ~~LOC~~ SOLN: 0.0000 [IU] | Freq: Every day | SUBCUTANEOUS | Status: DC

## 2015-11-12 MED ORDER — INSULIN GLARGINE 100 UNIT/ML ~~LOC~~ SOLN
30.0000 [IU] | Freq: Every day | SUBCUTANEOUS | Status: DC
  Administered 2015-11-13 (×2): 30 [IU] via SUBCUTANEOUS
  Filled 2015-11-12 (×3): qty 0.3

## 2015-11-12 MED ORDER — CLONIDINE HCL 0.2 MG PO TABS
0.2000 mg | ORAL_TABLET | Freq: Two times a day (BID) | ORAL | Status: DC
  Administered 2015-11-13 – 2015-11-14 (×4): 0.2 mg via ORAL
  Filled 2015-11-12 (×4): qty 1

## 2015-11-12 MED ORDER — SODIUM POLYSTYRENE SULFONATE 15 GM/60ML PO SUSP
30.0000 g | Freq: Once | ORAL | Status: AC
  Administered 2015-11-12: 30 g via ORAL
  Filled 2015-11-12: qty 120

## 2015-11-12 MED ORDER — VANCOMYCIN HCL 10 G IV SOLR
2000.0000 mg | Freq: Once | INTRAVENOUS | Status: AC
  Administered 2015-11-12: 2000 mg via INTRAVENOUS
  Filled 2015-11-12: qty 2000

## 2015-11-12 MED ORDER — DEXTROSE 50 % IV SOLN
INTRAVENOUS | Status: AC
  Filled 2015-11-12: qty 50

## 2015-11-12 MED ORDER — CEFEPIME HCL 1 G IJ SOLR
1.0000 g | Freq: Three times a day (TID) | INTRAMUSCULAR | Status: DC
  Filled 2015-11-12 (×3): qty 1

## 2015-11-12 MED ORDER — ACETAMINOPHEN 650 MG RE SUPP: 650.0000 mg | Freq: Four times a day (QID) | RECTAL | Status: DC | PRN

## 2015-11-12 MED ORDER — SENNOSIDES-DOCUSATE SODIUM 8.6-50 MG PO TABS: 1.0000 | ORAL_TABLET | Freq: Every evening | ORAL | Status: DC | PRN

## 2015-11-12 MED ORDER — DEXTROSE 5 % IV SOLN
2.0000 g | Freq: Once | INTRAVENOUS | Status: AC
  Administered 2015-11-12: 2 g via INTRAVENOUS
  Filled 2015-11-12: qty 2

## 2015-11-12 MED ORDER — SODIUM BICARBONATE 8.4 % IV SOLN
25.0000 meq | Freq: Once | INTRAVENOUS | Status: AC
  Administered 2015-11-12: 25 meq via INTRAVENOUS
  Filled 2015-11-12: qty 50

## 2015-11-12 MED ORDER — BRIMONIDINE TARTRATE 0.2 % OP SOLN
1.0000 [drp] | Freq: Three times a day (TID) | OPHTHALMIC | Status: DC
  Administered 2015-11-13 – 2015-11-14 (×5): 1 [drp] via OPHTHALMIC
  Filled 2015-11-12: qty 5

## 2015-11-12 MED ORDER — ENOXAPARIN SODIUM 30 MG/0.3ML ~~LOC~~ SOLN
30.0000 mg | SUBCUTANEOUS | Status: DC
  Administered 2015-11-13: 30 mg via SUBCUTANEOUS
  Filled 2015-11-12: qty 0.3

## 2015-11-12 MED ORDER — SODIUM CHLORIDE 0.9 % IJ SOLN: 3.0000 mL | INTRAMUSCULAR | Status: DC | PRN

## 2015-11-12 MED ORDER — SODIUM CHLORIDE 0.9 % IV SOLN: 250.0000 mL | INTRAVENOUS | Status: DC | PRN

## 2015-11-12 MED ORDER — HEPARIN SODIUM (PORCINE) 1000 UNIT/ML DIALYSIS
1000.0000 [IU] | INTRAMUSCULAR | Status: DC | PRN
  Filled 2015-11-12: qty 1

## 2015-11-12 MED ORDER — ACETAMINOPHEN 325 MG PO TABS: 650.0000 mg | ORAL_TABLET | Freq: Four times a day (QID) | ORAL | Status: DC | PRN

## 2015-11-12 MED ORDER — INSULIN ASPART 100 UNIT/ML ~~LOC~~ SOLN
3.0000 [IU] | Freq: Three times a day (TID) | SUBCUTANEOUS | Status: DC
  Administered 2015-11-13 – 2015-11-14 (×4): 3 [IU] via SUBCUTANEOUS

## 2015-11-12 MED ORDER — AMLODIPINE BESYLATE 5 MG PO TABS
10.0000 mg | ORAL_TABLET | Freq: Every day | ORAL | Status: DC
  Administered 2015-11-14: 10 mg via ORAL
  Filled 2015-11-12: qty 2

## 2015-11-12 MED ORDER — ONDANSETRON HCL 4 MG PO TABS: 4.0000 mg | ORAL_TABLET | Freq: Four times a day (QID) | ORAL | Status: DC | PRN

## 2015-11-12 MED ORDER — SODIUM CHLORIDE 0.9 % IJ SOLN
3.0000 mL | Freq: Two times a day (BID) | INTRAMUSCULAR | Status: DC
  Administered 2015-11-13 (×2): 3 mL via INTRAVENOUS

## 2015-11-12 MED ORDER — SODIUM CHLORIDE 0.9 % IJ SOLN
3.0000 mL | Freq: Two times a day (BID) | INTRAMUSCULAR | Status: DC
  Administered 2015-11-13 – 2015-11-14 (×2): 3 mL via INTRAVENOUS

## 2015-11-12 MED ORDER — PENTAFLUOROPROP-TETRAFLUOROETH EX AERO: 1.0000 "application " | INHALATION_SPRAY | CUTANEOUS | Status: DC | PRN

## 2015-11-12 MED ORDER — BRIMONIDINE TARTRATE 0.2 % OP SOLN
OPHTHALMIC | Status: AC
  Filled 2015-11-12: qty 5
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
## 2015-11-12 NOTE — ED Notes (Signed)
Pt brought in by EMS for respiratory distress. Pt reports sudden onset this am. Pt is on dialysis, T,TH,Sat. Sat on EMS arrival was 81. Pt did not tolerate CPAP or nonrebreather.

## 2015-11-12 NOTE — Progress Notes (Signed)
ANTIBIOTIC CONSULT NOTE-Preliminary  Pharmacy Consult for Cefepime and Vancomycin Indication: pneumonia  No Known Allergies  Patient Measurements: Height: 6\' 1"  (185.4 cm) Weight: 280 lb (127.007 kg) IBW/kg (Calculated) : 79.9  Vital Signs: Temp: 97.7 F (36.5 C) (01/16 1116) Temp Source: Oral (01/16 1116) BP: 175/102 mmHg (01/16 1312) Pulse Rate: 93 (01/16 1312)  Labs:  Recent Labs  11/12/15 1145  WBC 12.7*  HGB 11.1*  PLT 256  CREATININE 12.93*    Estimated Creatinine Clearance: 10.8 mL/min (by C-G formula based on Cr of 12.93).  No results for input(s): VANCOTROUGH, VANCOPEAK, VANCORANDOM, GENTTROUGH, GENTPEAK, GENTRANDOM, TOBRATROUGH, TOBRAPEAK, TOBRARND, AMIKACINPEAK, AMIKACINTROU, AMIKACIN in the last 72 hours.   Microbiology: No results found for this or any previous visit (from the past 720 hour(s)).  Medical History: Past Medical History  Diagnosis Date  . Diabetes mellitus   . Hypertension   . Glaucoma (increased eye pressure) 2011  . Microcytic anemia 04/25/2013  . 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 Blind in both eyes   . Shortness of breath dyspnea   . Chronic kidney disease   . Dialysis patient [facility](HCC)    Anti-infectives    Start     Dose/Rate Route Frequency Ordered Stop   11/12/15 1330  vancomycin (VANCOCIN) 2,000 mg in sodium chloride 0.9 % 500 mL IVPB     2,000 mg 250 mL/hr over 120 Minutes Intravenous  Once 11/12/15 1220     11/12/15 1230  ceFEPIme (MAXIPIME) 2 g in dextrose 5 % 50 mL IVPB     2 g 100 mL/hr over 30 Minutes Intravenous  Once 11/12/15 1220 11/12/15 1306     Assessment: 39yo male.  PMHx noted below including CKD; T,Th,Sat dialysis pt since 12/2014; and dyspnea who presents to the Emergency Department complaining of SOB onset this morning.  Goal of Therapy:  Pre-Hemodialysis Vancomycin level goal range =15-25 mcg/ml  Plan:  Preliminary review of pertinent patient information completed.  Protocol will be initiated with a  one-time dose(s) of Vancomycin 2gm and Cefepime 2gm.  (no additional doses needed today) Jeani HawkingAnnie Penn clinical pharmacist will complete review during morning rounds to assess patient and finalize treatment regimen.  Valrie HartHall, Mckaylah Bettendorf A, RPH 11/12/2015,1:29 PM

## 2015-11-12 NOTE — ED Notes (Signed)
Pt on bedside commode.

## 2015-11-12 NOTE — ED Notes (Signed)
K-6.6, EDP notified.

## 2015-11-12 NOTE — ED Notes (Signed)
Spoke with Marylene LandAngela, RN Dialysis nurse. Pt to be dialyzed today. Awaiting call back to transfer pt to dialysis.

## 2015-11-12 NOTE — Procedures (Signed)
   EMERGENT HEMODIALYSIS TREATMENT NOTE:  4.5 hour heparin-free emergent HD completed via left forearm AVF (15g ante/retrograde). Goal met: 4.5 liters removed without interruption in ultrafiltration. Hypertensive pre-, intra-, and post-dialysis with SBP 200s and DBP high 90s-low 100s. Blood cultures x2 obtained. All blood was returned and hemostasis was achieved in 20 minutes. Report called to [place], Therapist, sports.  Rockwell Alexandria, RN, CDN
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
## 2015-11-12 NOTE — ED Notes (Signed)
[facility] Charity fundraiserN. Pt ready for transport to Dialysis.

## 2015-11-13 ENCOUNTER — Encounter (HOSPITAL_COMMUNITY): Payer: Self-pay | Admitting: *Deleted

## 2015-11-13 LAB — GLUCOSE, CAPILLARY
Glucose-Capillary: 103 mg/dL — ABNORMAL HIGH (ref 65–99)
Glucose-Capillary: 158 mg/dL — ABNORMAL HIGH (ref 65–99)
Glucose-Capillary: 131 mg/dL — ABNORMAL HIGH (ref 65–99)
Glucose-Capillary: 122 mg/dL — ABNORMAL HIGH (ref 65–99)

## 2015-11-13 LAB — BASIC METABOLIC PANEL
Anion gap: 14 (ref 5–15)
BUN: 34 mg/dL — AB (ref 6–20)
CALCIUM: 8.3 mg/dL — AB (ref 8.9–10.3)
CO2: 30 mmol/L (ref 22–32)
CREATININE: 8.6 mg/dL — AB (ref 0.61–1.24)
Chloride: 96 mmol/L — ABNORMAL LOW (ref 101–111)
GFR calc non Af Amer: 7 mL/min — ABNORMAL LOW (ref >60)
GFR, EST AFRICAN AMERICAN: 8 mL/min — AB (ref >60)
GLUCOSE: 126 mg/dL — AB (ref 65–99)
Potassium: 4.7 mmol/L (ref 3.5–5.1)
Sodium: 140 mmol/L (ref 135–145)

## 2015-11-13 LAB — CBC
HCT: 32.7 % — ABNORMAL LOW (ref 39.0–52.0)
Hemoglobin: 10.4 g/dL — ABNORMAL LOW (ref 13.0–17.0)
MCH: 24.9 pg — AB (ref 26.0–34.0)
MCHC: 31.8 g/dL (ref 30.0–36.0)
MCV: 78.4 fL (ref 78.0–100.0)
PLATELETS: 239 10*3/uL (ref 150–400)
RBC: 4.17 MIL/uL — ABNORMAL LOW (ref 4.22–5.81)
RDW: 17.6 % — AB (ref 11.5–15.5)
WBC: 6.6 10*3/uL (ref 4.0–10.5)

## 2015-11-13 LAB — INFLUENZA PANEL BY PCR (TYPE A & B)
H1N1FLUPCR: NOT DETECTED
INFLBPCR: NEGATIVE
Influenza A By PCR: NEGATIVE

## 2015-11-13 LAB — STREP PNEUMONIAE URINARY ANTIGEN: Strep Pneumo Urinary Antigen: NEGATIVE

## 2015-11-13 LAB — HEMOGLOBIN A1C
HEMOGLOBIN A1C: 7.3 % — AB (ref 4.8–5.6)
Mean Plasma Glucose: 163 mg/dL

## 2015-11-13 LAB — HIV ANTIBODY (ROUTINE TESTING W REFLEX): HIV SCREEN 4TH GENERATION: NONREACTIVE

## 2015-11-13 LAB — HEPATITIS B SURFACE ANTIGEN: Hepatitis B Surface Ag: NEGATIVE

## 2015-11-13 MED ORDER — DEXTROSE 5 % IV SOLN
2.0000 g | Freq: Once | INTRAVENOUS | Status: AC
  Administered 2015-11-13: 2 g via INTRAVENOUS
  Filled 2015-11-13: qty 2

## 2015-11-13 MED ORDER — SODIUM CHLORIDE 0.9 % IV SOLN: 100.0000 mL | INTRAVENOUS | Status: DC | PRN

## 2015-11-13 MED ORDER — VANCOMYCIN HCL 10 G IV SOLR
2000.0000 mg | Freq: Once | INTRAVENOUS | Status: AC
  Administered 2015-11-13: 2000 mg via INTRAVENOUS
  Filled 2015-11-13: qty 2000

## 2015-11-13 MED ORDER — LIDOCAINE-PRILOCAINE 2.5-2.5 % EX CREA: 1.0000 "application " | TOPICAL_CREAM | CUTANEOUS | Status: DC | PRN

## 2015-11-13 MED ORDER — PENTAFLUOROPROP-TETRAFLUOROETH EX AERO: 1.0000 "application " | INHALATION_SPRAY | CUTANEOUS | Status: DC | PRN

## 2015-11-13 MED ORDER — LIDOCAINE HCL (PF) 1 % IJ SOLN: 5.0000 mL | INTRAMUSCULAR | Status: DC | PRN
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
## 2015-11-13 NOTE — Progress Notes (Signed)
ANTIBIOTIC CONSULT NOTE  Pharmacy Consult for Cefepime and Vancomycin Indication: pneumonia  No Known Allergies  Patient Measurements: Height:  (185.4 cm) Weight: 280 lb 6.8 oz (127.2 kg) IBW/kg (Calculated) : 79.9  Vital Signs: Temp: 98 F (36.7 C) (01/17 0635) Temp Source: Oral (01/17 0635) BP: 163/88 mmHg (01/17 0635) Pulse Rate: 92 (01/17 0635)  Labs:  Recent Labs  11/12/15 1145 11/13/15 0602  WBC 12.7* 6.6  HGB 11.1* 10.4*  PLT 256 239  CREATININE 12.93* 8.60*    Estimated Creatinine Clearance: 16.3 mL/min (by C-G formula based on Cr of 8.6).  No results for input(s): VANCOTROUGH, VANCOPEAK, VANCORANDOM, GENTTROUGH, GENTPEAK, GENTRANDOM, TOBRATROUGH, TOBRAPEAK, TOBRARND, AMIKACINPEAK, AMIKACINTROU, AMIKACIN in the last 72 hours.   Microbiology: Recent Results (from the past 720 hour(s))  Culture, blood (routine x 2) Call MD if unable to obtain prior to antibiotics being given     Status: None (Preliminary result)   Collection Time: 11/12/15  5:45 PM  Result Value Ref Range Status   Specimen Description BLOOD IV DRAW  Final   Special Requests BOTTLES DRAWN AEROBIC AND ANAEROBIC 8CC EACH  Final   Culture PENDING  Incomplete   Report Status PENDING  Incomplete  Culture, blood (routine x 2) Call MD if unable to obtain prior to antibiotics being given     Status: None (Preliminary result)   Collection Time: 11/12/15  6:10 PM  Result Value Ref Range Status   Specimen Description BLOOD IV DRAW  Final   Special Requests BOTTLES DRAWN AEROBIC AND ANAEROBIC [facility] EACH  Final   Culture PENDING  Incomplete   Report Status PENDING  Incomplete    Medical History: Past Medical History  Diagnosis Date  . Diabetes mellitus   . Hypertension   . Glaucoma (increased eye pressure) 2011  . Microcytic anemia 04/25/2013  . 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 Blind in both eyes   . Shortness of breath dyspnea   . Chronic kidney disease   . Dialysis patient [facility])     Anti-infectives    Start     Dose/Rate Route Frequency Ordered Stop   11/12/15 2200  ceFEPIme (MAXIPIME) 1 g in dextrose 5 % 50 mL IVPB  Status:  Discontinued     1 g 100 mL/hr over 30 Minutes Intravenous 3 times per day 11/12/15 1719 11/12/15 1727   11/12/15 1330  vancomycin (VANCOCIN) 2,000 mg in sodium chloride 0.9 % 500 mL IVPB     2,000 mg 250 mL/hr over 120 Minutes Intravenous  Once 11/12/15 1220 11/12/15 1542   11/12/15 1230  ceFEPIme (MAXIPIME) 2 g in dextrose 5 % 50 mL IVPB     2 g 100 mL/hr over 30 Minutes Intravenous  Once 11/12/15 1220 11/12/15 1306     Assessment: 39yo male.  PMHx noted below including CKD; T,Th,Sat dialysis pt since 12/2014; and dyspnea who presents to the Emergency Department complaining of SOB.  He received HD yesterday and plan is to get dialysis again today  Goal of Therapy:  Pre-Hemodialysis Vancomycin level goal range =15-25 mcg/ml  Plan:  Vancomycin 2 gm IV today after HD Cefepime 2 gm IV after HD today  Thanks for allowing pharmacy to be a part of this patient's care.  Talbert Cage, PharmD Clinical Pharmacist  11/13/2015,9:30 AM

## 2015-11-13 NOTE — Procedures (Signed)
   HEMODIALYSIS TREATMENT NOTE:  3 hour heparin-free dialysis completed via left forearm AVF (15g ante/retrograde). Goal NOT met: Unable to tolerate removal of 2.5 liters as ordered. Ultrafiltration was interrupted x 30 minutes for asymptomatic hypotension. Net UF 2 liters. Vancomycin and Cefepime given at end of HD. All blood was returned and hemostasis was achieved within 15 minutes. Report called to [facility], LPN.  Rockwell Alexandria, RN, CDN

## 2015-11-13 NOTE — Progress Notes (Signed)
Nutrition Brief Note  Patient identified on the Malnutrition Screening Tool (MST) Report  Wt Readings from Last 15 Encounters:  11/13/15 280 lb 13.9 oz (127.4 kg)  06/27/15 285 lb (129.275 kg)  06/19/15 285 lb (129.275 kg)  01/24/15 136 lb 3.9 oz (61.8 kg)  12/02/14 265 lb (120.203 kg)  05/30/14 265 lb (120.203 kg)  01/02/14 260 lb (117.935 kg)  10/31/13 260 lb (117.935 kg)  09/10/13 292 lb 15.9 oz (132.9 kg)  05/26/13 297 lb 9.6 oz (134.99 kg)  05/04/13 279 lb 15.8 oz (127 kg)  04/06/13 291 lb 4.8 oz (132.133 kg)  11/06/12 268 lb 4.8 oz (121.7 kg)  10/23/12 265 lb (120.203 kg)  10/16/12 260 lb (117.935 kg)    Body mass index is 37.06 kg/(m^2). Patient meets criteria for obesity based on current BMI. He came in volume overloaded after missing dialysis treatment last week on Thursday. He has had 6 liters removed since admission. Pt says his "dry wt " is 128 kg? He is disinterested in reviewing low sodium diet. He did say that his goal fluid daily is 32 oz and that he uses ice chips to help limit his fluid intake. Question accuracy of diet hx given his excess fluid status on admission.   Pt says he has been on dialysis since March of last year. His appetite is very good. Current diet order is Renal /Cho Modified 1.2 liter fluid restriction. Patient is consuming approximately 100% of meals at this time. Labs and medications reviewed. His A1C has improved since March  2016 from 9.8% down to 7.3%.  No nutrition interventions warranted at this time. If nutrition issues arise, please consult RD.   Royann Shivers MS,RD,CSG,LDN Office: [phone] Pager: [phone]

## 2015-11-13 NOTE — Progress Notes (Signed)
Subjective: Patient with occasional cough otherwise he is feeling much better. He denies any difficulty breathing or chest pain.   Objective: Vital signs in last 24 hours: Temp:  [97.7 F (36.5 C)-98.2 F (36.8 C)] 98 F (36.7 C) (01/17 0635) Pulse Rate:  [67-112] 92 (01/17 0635) Resp:  [14-22] 16 (01/17 0635) BP: (139-209)/(78-121) 163/88 mmHg (01/17 0635) SpO2:  [83 %-100 %] 100 % (01/17 0635) Weight:  [280 lb (127.007 kg)-285 lb 4.4 oz (129.4 kg)] 280 lb 6.8 oz (127.2 kg) (01/16 2124)  Intake/Output from previous day: 01/16 0701 - 01/17 0700 In: -  Out: 4547  Intake/Output this shift:     Recent Labs  11/12/15 1145  HGB 11.1*    Recent Labs  11/12/15 1145  WBC 12.7*  RBC 4.60  HCT 35.5*  PLT 256    Recent Labs  11/12/15 1145  NA 136  K 6.6*  CL 96*  CO2 24  BUN 63*  CREATININE 12.93*  GLUCOSE 213*  CALCIUM 7.8*   No results for input(s): LABPT, INR in the last 72 hours.  Generally patient is alert and in no apparent distress Chest: Decreased breath sound otherwise seems to be clear Heart exam revealed regular rate and rhythm no murmur Extremities no edema  Assessment/Plan: Problem #1 difficulty in breathing: Possibly from fluid overload/possible pneumonia. He is status post hemodialysis with 4 L removal. Presently patient is feeling much better. Problem #2 hypertension: His blood pressure is fluctuating but overall reasonable control Problem #3 anemia: His hemoglobin is above our target goal Problem #4 end-stage renal disease: Presently patient denies any uremic signs and symptoms. Problem #5 diabetes Problem #6 hyperkalemia: Patient was dialyzed yesterday and there is no blood work for today. Plan: We'll make arrangements for patient to get dialysis for 3 hours today We'll remove 2 and half liters his blood pressure tolerates. We'll check his basic metabolic panel in the morning. If patient is going to be discharged his next dialysis will be on  Thursday.   Donald Sampson S 11/13/2015, 7:48 AM

## 2015-11-14 LAB — BASIC METABOLIC PANEL
ANION GAP: 15 (ref 5–15)
BUN: 31 mg/dL — ABNORMAL HIGH (ref 6–20)
CALCIUM: 8 mg/dL — AB (ref 8.9–10.3)
CO2: 28 mmol/L (ref 22–32)
Chloride: 94 mmol/L — ABNORMAL LOW (ref 101–111)
Creatinine, Ser: 8.2 mg/dL — ABNORMAL HIGH (ref 0.61–1.24)
GFR, EST AFRICAN AMERICAN: 9 mL/min — AB (ref >60)
GFR, EST NON AFRICAN AMERICAN: 7 mL/min — AB (ref >60)
GLUCOSE: 104 mg/dL — AB (ref 65–99)
Potassium: 4.4 mmol/L (ref 3.5–5.1)
Sodium: 137 mmol/L (ref 135–145)

## 2015-11-14 LAB — GLUCOSE, CAPILLARY
GLUCOSE-CAPILLARY: 137 mg/dL — AB (ref 65–99)
Glucose-Capillary: 78 mg/dL (ref 65–99)

## 2015-11-14 LAB — LEGIONELLA ANTIGEN, URINE

## 2015-11-14 MED ORDER — DEXTROSE 5 % IV SOLN
2.0000 g | INTRAVENOUS | Status: DC
  Filled 2015-11-14: qty 2

## 2015-11-14 MED ORDER — VANCOMYCIN HCL IN DEXTROSE 1-5 GM/200ML-% IV SOLN: 1000.0000 mg | INTRAVENOUS | Status: DC

## 2015-11-14 NOTE — Progress Notes (Signed)
Donald Sampson  MRN: [id number]  DOB/AGE: July 31, 1976 40 y.o.  Primary Care Physician:Donald M, MD  Admit date: 11/12/2015  Chief Complaint:  Chief Complaint  Patient presents with  . Shortness of Breath    S-Pt presented on  11/12/2015 with  Chief Complaint  Patient presents with  . Shortness of Breath  .     Pt offers no new complaints.Pt says " I had back to back dialysis  My breathing is much better"    Meds . amLODipine  10 mg Oral Daily  . brimonidine  1 drop Left Eye TID  . [START ON 11/15/2015] ceFEPime (MAXIPIME) IV  2 g Intravenous Q T,Th,Sa-HD  . cloNIDine  0.2 mg Oral BID  . enoxaparin (LOVENOX) injection  30 mg Subcutaneous Q24H  . insulin aspart  0-5 Units Subcutaneous QHS  . insulin aspart  0-9 Units Subcutaneous TID WC  . insulin aspart  3 Units Subcutaneous TID WC  . insulin glargine  30 Units Subcutaneous QHS  . iron polysaccharides  150 mg Oral Daily  . labetalol  200 mg Oral BID  . sodium chloride  3 mL Intravenous Q12H  . sodium chloride  3 mL Intravenous Q12H  . [START ON 11/15/2015] vancomycin  1,000 mg Intravenous Q T,Th,Sa-HD      Physical Exam: Vital signs in last 24 hours: Temp:  [98 F (36.7 C)-99.3 F (37.4 C)] 98.5 F (36.9 C) (01/18 1191) Pulse Rate:  [85-98] 96 (01/18 0621) Resp:  [16-20] 20 (01/18 0621) BP: (102-178)/(67-94) 162/83 mmHg (01/18 0621) SpO2:  [97 %-100 %] 100 % (01/18 0806) Weight:  [274 lb 7.6 oz (124.5 kg)-280 lb 13.9 oz (127.4 kg)] 274 lb 7.6 oz (124.5 kg) (01/18 4782) Weight change: 13.9 oz (0.393 kg) Last BM Date: 11/11/15  Intake/Output from previous day: 01/17 0701 - 01/18 0700 In: 360 [P.O.:360] Out: 2406      Physical Exam: General- pt is awake,alert, oriented to time place and person Resp- No acute REsp distress, chest clear to ausculattaion CVS- S1S2 regular in rate and rhythm GIT- BS+, soft, NT, ND EXT-left BKA +, NO  edema right side Access-AVF left arm  Lab Results: CBC  Recent  Labs  11/12/15 1145 11/13/15 0602  WBC 12.7* 6.6  HGB 11.1* 10.4*  HCT 35.5* 32.7*  PLT 256 239    BMET  Recent Labs  11/13/15 0602 11/14/15 0543  NA 140 137  K 4.7 4.4  CL 96* 94*  CO2 30 28  GLUCOSE 126* 104*  BUN 34* 31*  CREATININE 8.60* 8.20*  CALCIUM 8.3* 8.0*      MICRO Recent Results (from the past 240 hour(s))  Culture, blood (routine x 2) Call MD if unable to obtain prior to antibiotics being given     Status: None (Preliminary result)   Collection Time: 11/12/15  5:45 PM  Result Value Ref Range Status   Specimen Description BLOOD A-LINE DRAWN BY RN  Final   Special Requests BOTTLES DRAWN AEROBIC AND ANAEROBIC 8CC EACH  Final   Culture NO GROWTH < 24 HOURS  Final   Report Status PENDING  Incomplete  Culture, blood (routine x 2) Call MD if unable to obtain prior to antibiotics being given     Status: None (Preliminary result)   Collection Time: 11/12/15  6:10 PM  Result Value Ref Range Status   Specimen Description BLOOD PICC LINE DRAWN BY RN  Final   Special Requests BOTTLES DRAWN AEROBIC AND ANAEROBIC [facility] EACH  Final  Culture NO GROWTH < 24 HOURS  Final   Report Status PENDING  Incomplete      Lab Results  Component Value Date   PTH 194* 01/19/2015   PTH Comment 01/19/2015   CALCIUM 8.0* 11/14/2015   PHOS 8.2* 01/19/2015     Impression: 1)Renal  ESRD on HD                ESRD initiated on 01/21/15                ESRD sec to DM                Strong family hx of ESRD                Strong hx of NON complaince                 Pt was dialyzed yesterday                 NO need of HD today  2)HTN   Medication- On alpha + beta blockers On Calcium Channel Blockers On Central sympatholytics   3)Anemia HGb  at goal (9--11) Iron deficiency and Chronic disease Chronic disease On epo during HD  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 Calcium  at goal when corrected for  albumin  5)ID- Pneumoinia On ABX  6)Electrolytes  Hyperkalemic-now better  Normonatremic    7)Acid base Co2 at goal   8) Resp-admitted with dyspnea     Fluid overload vs Pneumonia      Pt underwent HD for fluid removal      Pt on IV ABX      Primary team has asked for pul conslt  Plan:  NO need of HD today Will dialyze in am  if pt is still inpt.    [name] 11/14/2015, 8:43 AM

## 2015-11-14 NOTE — Care Management Note (Signed)
Case Management Note  Patient Details  Name: Donald Sampson MRN: [id number] Date of Birth: 11-Feb-1976  Subjective/Objective:             Spoke with patient  From home alert and oriented. Stated has wheelchair  No home O2.  No Cm needs Identified.   Action/Plan:  Home with self care.  Expected Discharge Date:                  Expected Discharge Plan:  Home/Self Care  In-House Referral:     Discharge planning Services  CM Consult  Post Acute Care Choice:    Choice offered to:  NA  DME Arranged:    DME Agency:     HH Arranged:    HH Agency:     Status of Service:  Completed, signed off  Medicare Important Message Given:  Yes Date Medicare IM Given:    Medicare IM give by:    Date Additional Medicare IM Given:    Additional Medicare Important Message give by:     If discussed at Long Length of Stay Meetings, dates discussed:    Additional Comments:  Adonis Huguenin, RN 11/14/2015, 9:35 AM

## 2015-11-14 NOTE — Progress Notes (Signed)
Pt discharged home today per Dr. Janna Arch.  Pt's IV site D/C'd and WDL.  Pt's VSS.  Pt provided with home medication list and discharge instructions.  Verbalized understanding.  Pt left floor via WC in stable condition accompanied by NT.
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
## 2015-11-14 NOTE — Care Management Important Message (Signed)
Important Message  Patient Details  Name: Donald Sampson MRN: [id number] Date of Birth: Jun 06, 1976   Medicare Important Message Given:  Yes    Adonis Huguenin, RN 11/14/2015, 9:27 AM
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
## 2015-11-17 LAB — CULTURE, BLOOD (ROUTINE X 2)
CULTURE: NO GROWTH
Culture: NO GROWTH

## 2016-01-07 ENCOUNTER — Emergency Department (HOSPITAL_COMMUNITY)
Admission: EM | Admit: 2016-01-07 | Discharge: 2016-01-26 | Disposition: E | Payer: Medicare Other | Attending: Emergency Medicine | Admitting: Emergency Medicine

## 2016-01-07 ENCOUNTER — Encounter (HOSPITAL_COMMUNITY): Payer: Self-pay

## 2016-01-07 DIAGNOSIS — N186 End stage renal disease: Secondary | ICD-10-CM | POA: Insufficient documentation

## 2016-01-07 DIAGNOSIS — Z992 Dependence on renal dialysis: Secondary | ICD-10-CM | POA: Diagnosis not present

## 2016-01-07 DIAGNOSIS — E875 Hyperkalemia: Secondary | ICD-10-CM | POA: Diagnosis not present

## 2016-01-07 DIAGNOSIS — Z794 Long term (current) use of insulin: Secondary | ICD-10-CM | POA: Insufficient documentation

## 2016-01-07 DIAGNOSIS — I469 Cardiac arrest, cause unspecified: Secondary | ICD-10-CM | POA: Diagnosis present

## 2016-01-07 DIAGNOSIS — E1121 Type 2 diabetes mellitus with diabetic nephropathy: Secondary | ICD-10-CM | POA: Diagnosis not present

## 2016-01-07 DIAGNOSIS — I120 Hypertensive chronic kidney disease with stage 5 chronic kidney disease or end stage renal disease: Secondary | ICD-10-CM | POA: Diagnosis not present

## 2016-01-07 DIAGNOSIS — Z79899 Other long term (current) drug therapy: Secondary | ICD-10-CM | POA: Insufficient documentation

## 2016-01-07 LAB — I-STAT CHEM 8, ED
BUN: 43 mg/dL — AB (ref 6–20)
CALCIUM ION: 0.92 mmol/L — AB (ref 1.12–1.23)
CHLORIDE: 106 mmol/L (ref 101–111)
CREATININE: 9.2 mg/dL — AB (ref 0.61–1.24)
GLUCOSE: 391 mg/dL — AB (ref 65–99)
HCT: 39 % (ref 39.0–52.0)
Hemoglobin: 13.3 g/dL (ref 13.0–17.0)
Potassium: 6.3 mmol/L (ref 3.5–5.1)
Sodium: 134 mmol/L — ABNORMAL LOW (ref 135–145)
TCO2: 13 mmol/L (ref 0–100)

## 2016-01-07 LAB — CBG MONITORING, ED: Glucose-Capillary: 249 mg/dL — ABNORMAL HIGH (ref 65–99)

## 2016-01-07 MED ORDER — NALOXONE HCL 2 MG/2ML IJ SOSY
PREFILLED_SYRINGE | INTRAMUSCULAR | Status: DC | PRN
Start: 2016-01-07 — End: 2016-01-07
  Administered 2016-01-07: 2 mg via INTRAVENOUS

## 2016-01-07 MED ORDER — CALCIUM CHLORIDE 10 % IV SOLN
INTRAVENOUS | Status: DC | PRN
  Administered 2016-01-07: 1 g via INTRAVENOUS

## 2016-01-07 MED ORDER — NALOXONE HCL 2 MG/2ML IJ SOSY
PREFILLED_SYRINGE | INTRAMUSCULAR | Status: AC
  Filled 2016-01-07: qty 2

## 2016-01-07 MED ORDER — SODIUM BICARBONATE 8.4 % IV SOLN
INTRAVENOUS | Status: DC | PRN
  Administered 2016-01-07: 50 meq via INTRAVENOUS

## 2016-01-07 MED ORDER — EPINEPHRINE HCL 0.1 MG/ML IJ SOSY
PREFILLED_SYRINGE | INTRAMUSCULAR | Status: DC | PRN
Start: 2016-01-07 — End: 2016-01-07
  Administered 2016-01-07 (×2): 1 mg via INTRAVENOUS

## 2016-01-07 MED FILL — Medication: Qty: 1 | Status: AC

## 2016-01-26 NOTE — Code Documentation (Signed)
No pulse, no cardiac activity seen on ultrasound,  Pt aystole on monitor.
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
## 2016-01-26 NOTE — Code Documentation (Signed)
Patient time of death occurred at 0814. 

## 2016-01-26 NOTE — ED Notes (Signed)
Per ems, they were called out for respiratory distress.  When pt arrived, pt was unresponsive but breathing.  EMS loaded pt in ems and pt went in asystole.  Pt had 4 epi and 1 amp bicarb.  Pt is a dialysis pt and is supposed to have dialysis tomorrow.  EMS reports had not regained any pulses but scene was insecure so they transported pt to er.

## 2016-01-26 NOTE — ED Notes (Signed)
Called and spoke with mother. States pt's son and brother were coming to see patient before we transport him. Also spoke with mother about [facility] home. [facility]. Claris CheMargaret, Diplomatic Services operational officersecretary noted.

## 2016-01-26 NOTE — Code Documentation (Signed)
cpr continued. 

## 2016-01-26 NOTE — ED Notes (Signed)
Chaplain has been paged for family.
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
## 2016-01-26 NOTE — Code Documentation (Signed)
Pt PEA at this time.

## 2016-01-26 NOTE — ED Notes (Signed)
[facility]
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
## 2016-01-26 NOTE — ED Provider Notes (Signed)
CSN: [id number]     Arrival date & time 05/09/2016  0753 History   First MD Initiated Contact with Patient 007/14/2017 [phone]     Chief Complaint  Patient presents with  . Cardiac Arrest     (Consider location/radiation/quality/duration/timing/severity/associated sxs/prior Treatment) HPI  Patient is a 40 year old male, he is known to have insulin-dependent diabetes, hypertension, end-stage renal disease, blindness, he is on dialysis and was supposed to have dialysis tomorrow however overnight he started to have some increased shortness of breath, wheezing and according to his girlfriend who he stays with this became more severe this morning. He had breakfast around 5:00 in the morning, at 5:30 he was having increased difficulty breathing, she brought him to the doorway to get some fresh air during which time he essentially collapsed. On arrival the paramedics found the patient to be supported in a chair in the doorway to the house, totally unresponsive with a rhythm of PEA. They started CPR, gave 4 mg of epinephrine, one dose of bicarbonate and transported the patient to the hospital. They noted that he had a large amount of vomiting in his mouth during resuscitation.  Past Medical History  Diagnosis Date  . Diabetes mellitus   . Hypertension   . Glaucoma (increased eye pressure) 2011  . Microcytic anemia 04/25/2013  . 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 Blind in both eyes   . Shortness of breath dyspnea   . Chronic kidney disease   . Dialysis patient [facility](HCC)    Past Surgical History  Procedure Laterality Date  . Refractive surgery    . Left bka    . Eye surgery    . Ij catheter for dialysis    . Av fistula placement Left 06/27/2015    Procedure: RADIAL CEPHALIC ARTERIOVENOUS (AV) FISTULA CREATION;  Surgeon: Renford DillsGregory G Schnier, MD;  Location: [facility] ORS;  Service: Vascular;  Laterality: Left;   Family History  Problem Relation Age of Onset  . Hypertension Mother   . Diabetes Mother    Social  History  Substance Use Topics  . Smoking status: Never Smoker   . Smokeless tobacco: Never Used  . Alcohol Use: No    Review of Systems  Unable to perform ROS: Patient unresponsive      Allergies  Review of patient's allergies indicates no known allergies.  Home Medications   Prior to Admission medications   Medication Sig Start Date End Date Taking? Authorizing Provider  amLODipine (NORVASC) 10 MG tablet Take 10 mg by mouth daily. 09/25/15   Historical Provider, MD  brimonidine (ALPHAGAN) 0.2 % ophthalmic solution Place 1 drop into the left eye 3 (three) times daily. 12/02/14   Ivery QualeHobson Bryant, PA-C  cloNIDine (CATAPRES) 0.2 MG tablet Take 1 tablet (0.2 mg total) by mouth 2 (two) times daily. 01/24/15   Oval Linseyichard Dondiego, MD  insulin aspart (NOVOLOG) 100 UNIT/ML injection Inject 15 Units into the skin 3 (three) times daily with meals. 09/12/13   Avon Gullyesfaye Fanta, MD  insulin glargine (LANTUS) 100 UNIT/ML injection Inject 30 Units into the skin at bedtime.     Historical Provider, MD  iron polysaccharides (NIFEREX) 150 MG capsule Take 1 capsule (150 mg total) by mouth daily. 01/24/15   Oval Linseyichard Dondiego, MD  labetalol (NORMODYNE) 200 MG tablet Take 200 mg by mouth 2 (two) times daily.  10/18/15   Historical Provider, MD  linagliptin (TRADJENTA) 5 MG TABS tablet Take 5 mg by mouth daily.    Historical Provider, MD  oxyCODONE (OXY IR/ROXICODONE) 5 MG  immediate release tablet Take 5 mg by mouth every 6 (six) hours as needed for severe pain.    Historical Provider, MD   There were no vitals taken for this visit. Physical Exam  There were no vitals taken for this visit.  Physical Exam  Constitutional:  Unresponsive  HENT:  Oropharynx filled with food particles and vomit, nasal passages are clear  Eyes:  Bilateral hazy and scarred conjunctiva and globes  Neck:  Supple neck, no masses  Cardiovascular:  No intrinsic rhythm, no pulses, no spontaneous cardiac activity, CPR being performed on  arrival and continued throughout the resuscitation  Pulmonary/Chest:  No spontaneous breathing, assisted ventilations with some chest rise, bag-valve-mask on arrival  Abdominal:  Distended tense abdomen, no obvious masses  Musculoskeletal:  Left lower extremity amputation, no obvious deformities to the extremities, no crepitance over the chest wall, intraosseous line present in the right lower extremity  Neurological:  GCS of 3  Skin:  No open wounds    ED Course  .Intubation Date/Time: 2016/01/22 9:15 AM Performed by: Eber Hong Authorized by: Eber Hong Consent: The procedure was performed in an emergent situation. Time out: Immediately prior to procedure a "time out" was called to verify the correct patient, procedure, equipment, support staff and site/side marked as required. Indications: respiratory failure Intubation method: video-assisted Preoxygenation: none Pretreatment medications: none Paralytic: rocuronium Laryngoscope size: Mac 4 Tube size: 7.5 mm Tube type: cuffed Number of attempts: 2 Ventilation between attempts: BVM Cricoid pressure: no Cords visualized: yes Post-procedure assessment: chest rise and CO2 detector Breath sounds: equal Cuff inflated: yes  .Cardioversion Date/Time: 22-Jan-2016 8:00 AM Performed by: Eber Hong Authorized by: Eber Hong Consent: The procedure was performed in an emergent situation. Time out: Immediately prior to procedure a "time out" was called to verify the correct patient, procedure, equipment, support staff and site/side marked as required. Patient sedated: no Cardioversion basis: emergent Pre-procedure rhythm: ventricular fibrillation Patient position: patient was placed in a supine position Chest area: chest area exposed Electrodes: pads Electrodes placed: anterior-posterior Number of attempts: 1 Attempt 1 mode: asynchronous Attempt 1 waveform: biphasic Attempt 1 shock (in Joules): 200 Cardioversion  outcome attempt one: asystole. Post-procedure rhythm: asystole.   (including critical care time) Labs Review Labs Reviewed  I-STAT CHEM 8, ED - Abnormal; Notable for the following:    Sodium 134 (*)    Potassium 6.3 (*)    BUN 43 (*)    Creatinine, Ser 9.20 (*)    Glucose, Bld 391 (*)    Calcium, Ion 0.92 (*)    All other components within normal limits  CBG MONITORING, ED - Abnormal; Notable for the following:    Glucose-Capillary 249 (*)    All other components within normal limits    Imaging Review No results found. I have personally reviewed and evaluated these images and lab results as part of my medical decision-making.    MDM   Final diagnoses:  Cardiac arrest (HCC)  Hyperkalemia    The patient is critically ill, he has multiple comorbidities including diabetes, hypertension, hyperlipidemia and end-stage renal disease on dialysis. His vital signs were so unstable that the they were unmeasurable. The patient never regained spontaneous circulation despite multiple doses of medication prehospital including epinephrine 4, bicarbonate times one, he was then given a complement of more epinephrine, calcium, bicarbonate, insulin 10 units, D50, continuous CPR, amiodarone, defibrillation. The patient's time of death was declared, I discussed his case with his family doctor, Dr. Delbert Harness who has agreed  to sign the death certificate and is in agreement that the patient does not warrant medical examiner evaluation as he likely had multiple comorbidities which caused his death.   Cardiopulmonary Resuscitation (CPR) Procedure Note Directed/Performed by: Vida Roller I personally directed ancillary staff and/or performed CPR in an effort to regain return of spontaneous circulation and to maintain cardiac, neuro and systemic perfusion.   Chaplain called, family informed of the patient's outcome    Eber Hong, MD 12-Jan-2016 [phone]

## 2016-01-26 NOTE — Code Documentation (Signed)
10units novolog insulin iv given

## 2016-01-26 NOTE — ED Notes (Signed)
Pt taken to morgue. 

## 2016-01-26 NOTE — Code Documentation (Signed)
Continuing cpr

## 2016-01-26 NOTE — Code Documentation (Signed)
Amiodarone 300mg  administered IV

## 2016-01-26 DEATH — deceased
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
